# Patient Record
Sex: Female | Born: 1980 | Race: White | Hispanic: No | Marital: Married | Smoking: Never smoker
Health system: Southern US, Community
[De-identification: ages and names within clinical notes are randomized; demographics above are authoritative.]

## PROBLEM LIST (undated history)

## (undated) ENCOUNTER — Inpatient Hospital Stay (HOSPITAL_COMMUNITY): Payer: Self-pay

## (undated) DIAGNOSIS — N3010 Interstitial cystitis (chronic) without hematuria: Secondary | ICD-10-CM

## (undated) DIAGNOSIS — I82812 Embolism and thrombosis of superficial veins of left lower extremities: Secondary | ICD-10-CM

## (undated) DIAGNOSIS — Z8759 Personal history of other complications of pregnancy, childbirth and the puerperium: Secondary | ICD-10-CM

## (undated) DIAGNOSIS — N3281 Overactive bladder: Secondary | ICD-10-CM

## (undated) DIAGNOSIS — F439 Reaction to severe stress, unspecified: Secondary | ICD-10-CM

## (undated) DIAGNOSIS — O2230 Deep phlebothrombosis in pregnancy, unspecified trimester: Secondary | ICD-10-CM

## (undated) DIAGNOSIS — B009 Herpesviral infection, unspecified: Secondary | ICD-10-CM

## (undated) DIAGNOSIS — B977 Papillomavirus as the cause of diseases classified elsewhere: Secondary | ICD-10-CM

## (undated) DIAGNOSIS — N870 Mild cervical dysplasia: Secondary | ICD-10-CM

## (undated) DIAGNOSIS — A749 Chlamydial infection, unspecified: Secondary | ICD-10-CM

## (undated) DIAGNOSIS — D6851 Activated protein C resistance: Secondary | ICD-10-CM

## (undated) DIAGNOSIS — I10 Essential (primary) hypertension: Secondary | ICD-10-CM

## (undated) DIAGNOSIS — R87629 Unspecified abnormal cytological findings in specimens from vagina: Secondary | ICD-10-CM

## (undated) DIAGNOSIS — I82402 Acute embolism and thrombosis of unspecified deep veins of left lower extremity: Secondary | ICD-10-CM

## (undated) HISTORY — DX: Personal history of other complications of pregnancy, childbirth and the puerperium: Z87.59

## (undated) HISTORY — DX: Reaction to severe stress, unspecified: F43.9

## (undated) HISTORY — DX: Interstitial cystitis (chronic) without hematuria: N30.10

## (undated) HISTORY — DX: Deep phlebothrombosis in pregnancy, unspecified trimester: O22.30

## (undated) HISTORY — PX: COLPOSCOPY: SHX161

## (undated) HISTORY — DX: Overactive bladder: N32.81

## (undated) HISTORY — DX: Herpesviral infection, unspecified: B00.9

## (undated) HISTORY — DX: Activated protein C resistance: D68.51

## (undated) HISTORY — DX: Papillomavirus as the cause of diseases classified elsewhere: B97.7

## (undated) HISTORY — DX: Chlamydial infection, unspecified: A74.9

## (undated) HISTORY — PX: OTHER SURGICAL HISTORY: SHX169

## (undated) HISTORY — DX: Essential (primary) hypertension: I10

## (undated) HISTORY — DX: Unspecified abnormal cytological findings in specimens from vagina: R87.629

## (undated) HISTORY — DX: Mild cervical dysplasia: N87.0

## (undated) HISTORY — DX: Acute embolism and thrombosis of unspecified deep veins of left lower extremity: I82.402

---

## 2002-02-12 ENCOUNTER — Other Ambulatory Visit: Admission: RE | Admit: 2002-02-12 | Discharge: 2002-02-12 | Payer: Self-pay | Admitting: Obstetrics and Gynecology

## 2004-10-01 ENCOUNTER — Ambulatory Visit (HOSPITAL_COMMUNITY): Admission: RE | Admit: 2004-10-01 | Discharge: 2004-10-01 | Payer: Self-pay | Admitting: Obstetrics & Gynecology

## 2004-10-06 ENCOUNTER — Inpatient Hospital Stay (HOSPITAL_COMMUNITY): Admission: AD | Admit: 2004-10-06 | Discharge: 2004-10-10 | Payer: Self-pay | Admitting: Obstetrics & Gynecology

## 2007-10-05 ENCOUNTER — Other Ambulatory Visit: Admission: RE | Admit: 2007-10-05 | Discharge: 2007-10-05 | Payer: Self-pay | Admitting: Obstetrics and Gynecology

## 2008-10-06 ENCOUNTER — Other Ambulatory Visit: Admission: RE | Admit: 2008-10-06 | Discharge: 2008-10-06 | Payer: Self-pay | Admitting: Obstetrics and Gynecology

## 2009-11-18 ENCOUNTER — Other Ambulatory Visit: Admission: RE | Admit: 2009-11-18 | Discharge: 2009-11-18 | Payer: Self-pay | Admitting: Obstetrics and Gynecology

## 2010-12-25 ENCOUNTER — Encounter: Payer: Self-pay | Admitting: Obstetrics & Gynecology

## 2011-01-28 ENCOUNTER — Other Ambulatory Visit (HOSPITAL_COMMUNITY)
Admission: RE | Admit: 2011-01-28 | Discharge: 2011-01-28 | Disposition: A | Discharge status: Home / Self Care | Payer: BC Managed Care – PPO | Source: Ambulatory Visit | Attending: Obstetrics & Gynecology | Admitting: Obstetrics & Gynecology

## 2011-01-28 DIAGNOSIS — Z01419 Encounter for gynecological examination (general) (routine) without abnormal findings: Secondary | ICD-10-CM | POA: Insufficient documentation

## 2011-01-28 DIAGNOSIS — Z113 Encounter for screening for infections with a predominantly sexual mode of transmission: Secondary | ICD-10-CM | POA: Insufficient documentation

## 2011-04-22 NOTE — H&P (Signed)
NAME:  Sheila Cline, Sheila Cline                ACCOUNT NO.:  [id number]   MEDICAL RECORD NO.:  [id number]           PATIENT TYPE:   LOCATION:                                 FACILITY:   PHYSICIAN:  Lazaro Arms, M.D.        DATE OF BIRTH:   DATE OF ADMISSION:  DATE OF DISCHARGE:  LH                                HISTORY & PHYSICAL   Sheila Cline is a 30 year old white female, gravida 1, para 0, estimated date of  delivery 10/12/2004 (that is by last menstrual period and 10 week sonogram)  is currently at 39 weeks and 2 days gestation.  The patient is admitted for  cervical ripening and induction of labor.  She has been having some  contractions over the last day or so.  She is very motivated to have a  vaginal delivery and she has an estimated fetal weight of just under 8  pounds at 3387 grams by ultrasound the end of last week.  She has an  adequate pelvis but certainly, I think, she is at the upper limit of what  she will be able to handle vaginally.  Traditionally the patient's blood  pressure is creeping up a little bit today.  Probably partly because of her  pain and discomfort.  It is 130/90 in the office today; and, as a result she  is admitted for cervical ripening tonight and induction tomorrow.   PAST MEDICAL HISTORY:  Negative.   PAST SURGERY:  She had a tympanoplasty as a child.   ALLERGIES:  Negative.   MEDICATIONS:  Zyrtec.   REVIEW OF SYSTEMS:  Otherwise negative.   PAST OBSTETRICAL:  She is nulliparous.  Blood type is A positive, urine drug  screen is negative.  Rubella is immune.  Hepatitis B was negative, HIV was  nonreactive.  Serology was nonreactive.  Pap was normal, GC and Chlamydia  were negative in the first trimester and at 36 weeks.  Her group B strep  culture is negative.  Her AFP was normal.  Her Glucola was normal at 121.  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  She has been in suppression  since 36 weeks.  She has never had any lesions.   PHYSICAL  EXAMINATION:  HEENT:  Unremarkable.  NECK:  Thyroid is normal.  LUNGS:  Lungs are clear.  HEART:  Regular rate and rhythm without regurgitation or gallop.  BREASTS:  Deferred.  ABDOMEN:  Fundal height of 42 cm.  Cervix is 1-2, 50, and minus 2 vertex,  soft and midplane.  EXTREMITIES:  Warm, 1+ edema.   IMPRESSION:  1.  Intrauterine pregnancy at 39+ weeks gestation.  2.  Favorable cervix.  3.  Estimated fetal weight of approximately 8 pounds with my impression that      she has a pelvis adequate for delivery of estimated fetal weight at this      level but really not greater.  4.  Increasing blood pressure.   PLAN:  The patient is admitted for Foley bulb ripening, induction of labor.  She understands the  benefits, indications, and agrees to proceed.     Luth   LHE/MEDQ  D:  10/06/2004  T:  10/06/2004  Job:  [id number]

## 2011-04-22 NOTE — Op Note (Signed)
Sheila Cline, HEINEY                ACCOUNT NO.:  [id number]   MEDICAL RECORD NO.:  [id number]          PATIENT TYPE:  INP   LOCATION:  A412                          FACILITY:  [facility]   PHYSICIAN:  Tilda Burrow, M.D. DATE OF BIRTH:  Jul 27, 1981   DATE OF PROCEDURE:  10/07/2004  DATE OF DISCHARGE:  10/10/2004                                 OPERATIVE REPORT   PREOPERATIVE DIAGNOSES:  1.  Pregnancy at 39 weeks.  2.  Failed induction with suspected cephalopelvic disproportion.   POSTOPERATIVE DIAGNOSES:  1.  Pregnancy at 39 weeks.  2.  Failed induction with suspected cephalopelvic disproportion.   PROCEDURE:  Primary low transverse cervical cesarean section.   SURGEON:  Tilda Burrow, M.D.   ASSISTANTAmie Critchley, C.S.T.   ANESTHESIA:  Epidural.   COMPLICATIONS:  None.   ESTIMATED BLOOD LOSS:  500 mL.   FINDINGS:  Asynclitic presentation of an occiput posterior presentation for  the infant.   DETAILS OF PROCEDURE:  The patient was taken to the operating room with  epidural topped up to achieve adequate analgesia.  A Pfannenstiel-type  incision was performed in standard fashion through the skin and subcu  tissues with Pfannenstiel technique, opening of the fascia, and midline  entry into the peritoneal cavity without difficulty.  A bladder flap was  developed down to the lower uterine segment and a transverse nick made in  the lower uterine segment, identifying the fetal vertex.  Lateral traction  on the incision opened it sufficiently to allow the operator's left hand to  be introduced beneath the vertex, which was rotated into the incision, and  fundal pressure used to deliver the infant.  The vertex delivered well after  being repositioned from its asynclitic occiput posterior presentation.   The cord was clamped and the infant placed in the care of the waiting  pediatrician.   Cord blood samples were obtained and the uterus irrigated with antibiotic-  containing  solution and then the uterus closed using running locking 0  chromic suture ligature.  Hemostasis was satisfactorily achieved and bladder  flap reapproximated with 2-0 chromic.  The abdomen was irrigated with  antibiotic solution, anterior peritoneum closed with 2-0 chromic, the fascia  closed with 0 Vicryl, subcu tissues reapproximated with 2-0 plain  interrupted sutures,  and staple closure of the skin completed the procedure with 500 mL estimated  blood loss.  The patient tolerated the procedure well, went to the recovery  room in good condition, where the epidural catheter was removed with tip  visualized as intact.     [name]   JVF/MEDQ  D:  10/17/2004  T:  10/18/2004  Job:  [id number]   cc:   Francoise Schaumann. Halm, D.O.  [address]  [place]  [place] [address]  Fax: [phone]

## 2011-04-22 NOTE — Discharge Summary (Signed)
Sheila Cline, Sheila Cline                ACCOUNT NO.:  [id number]   MEDICAL RECORD NO.:  [id number]          PATIENT TYPE:  INP   LOCATION:  A412                          FACILITY:  [facility]   PHYSICIAN:  Tilda Burrow, M.D. DATE OF BIRTH:  02-22-1981   DATE OF ADMISSION:  10/06/2004  DATE OF DISCHARGE:  11/06/2005LH                                 DISCHARGE SUMMARY   ADMISSION DIAGNOSES:  1.  Pregnancy [redacted] weeks gestation.  2.  Medical induction of labor, patient requesting.   DISCHARGE DIAGNOSES:  1.  Pregnancy 39 weeks delivered.  2.  Failed induction secondary to fetal malpresentation (asynclitic occiput      posterior).   PROCEDURE:  1.  Foley bulb cervical ripening, Luke Eure/Darlene Lawson.  2.  Pitocin induction of labor.  3.  Epidural catheter placement, J.V. Emelda Fear.  4.  Primary low transverse cervical cesarean section, J.V. Ferguson.   DISCHARGE MEDICATIONS:  1.  Tylox one to two q.4 h. p.r.n. pain, dispense #15.  2.  Motrin 600 mg p.o. q.6 h. p.r.n. mild pain.  3.  Chromogen Forte one p.o. b.i.d. x30 days.   FOLLOW UP:  One week staple removal.   HOSPITAL SUMMARY:  This 30 year old primiparous female requested labor  induction due to persistent pressure and discomfort.  She was felt to have  small suggestions of developing pregnancy-associated hypertension with blood  pressure 130/90 in the office.  She was admitted for cervical ripening  induction with negative protein, greater than normal fundal height at 42 cm,  estimated fetal weight of approximately 8 pounds with pelvis considered  borderline at initial assessment.   HOSPITAL COURSE:  The patient was admitted, had Foley bulb placed overnight  with good cervical change to 3 to 4 cm.  She unfortunately stayed at 3 to 4  cm despite good labor through the day.  Cervix was stretchable to 5 cm/75%/-  2 station @ mid afternoon with epidural catheter in place.  She made no  further progress and despite good labor well  documented by palpation and  monitoring we proceeded to cesarean delivery, delivering a healthy infant  found to be in occiput posterior presentation with slightly asynclitic  presentation.  Almost all of the caput was to one side of the occiput.   The patient was stable for discharge on the third postoperative day  tolerating a regular diet with follow up in 1 week staple removal in our  office.     [name]   JVF/MEDQ  D:  10/17/2004  T:  10/17/2004  Job:  [id number]

## 2011-04-22 NOTE — Op Note (Signed)
Sheila Cline, CANNY                ACCOUNT NO.:  [id number]   MEDICAL RECORD NO.:  [id number]          PATIENT TYPE:  INP   LOCATION:  A412                          FACILITY:  [facility]   PHYSICIAN:  Tilda Burrow, M.D. DATE OF BIRTH:  December 11, 1980   DATE OF PROCEDURE:  DATE OF DISCHARGE:  10/10/2004                                 OPERATIVE REPORT   PROCEDURE:  Epidural catheter placement, 3 p.m., October 07, 2004.   The patient requested epidural after progressing slowly in labor on October 07, 2004, going slowly from 3 cm stretchable to 5 cm, 50%, -1 to -2, with  vertex very molded.  Epidural catheter was placed with the patient in  sitting position, flexed forward, having obtained appropriate consent.  The  back was prepped and draped and loss of resistance technique used to  identify the epidural space.  A 5 mL test dose of 1.5% Xylocaine with  epinephrine was injected, followed by insertion of the epidural catheter 3  cm into the epidural space, removal of the Tuohy needle, taping of catheter  to the back, and follow-up administration of a 7 mL test dose of 0.125%  Marcaine with 2 mcg/mL of fentanyl.  We then used the same solution as 12  mL/hr for labor management.  The patient tolerated the procedure with good  analgesic effect.     [name]   JVF/MEDQ  D:  10/17/2004  T:  10/18/2004  Job:  [id number]

## 2011-08-25 ENCOUNTER — Ambulatory Visit (INDEPENDENT_AMBULATORY_CARE_PROVIDER_SITE_OTHER): Payer: BC Managed Care – PPO | Admitting: Otolaryngology

## 2011-08-25 DIAGNOSIS — H6980 Other specified disorders of Eustachian tube, unspecified ear: Secondary | ICD-10-CM

## 2011-08-25 DIAGNOSIS — H6520 Chronic serous otitis media, unspecified ear: Secondary | ICD-10-CM

## 2011-08-25 DIAGNOSIS — H6990 Unspecified Eustachian tube disorder, unspecified ear: Secondary | ICD-10-CM

## 2011-08-25 DIAGNOSIS — J310 Chronic rhinitis: Secondary | ICD-10-CM

## 2011-09-15 ENCOUNTER — Ambulatory Visit (INDEPENDENT_AMBULATORY_CARE_PROVIDER_SITE_OTHER): Payer: BC Managed Care – PPO | Admitting: Otolaryngology

## 2011-10-20 ENCOUNTER — Ambulatory Visit (INDEPENDENT_AMBULATORY_CARE_PROVIDER_SITE_OTHER): Payer: BC Managed Care – PPO | Admitting: Otolaryngology

## 2011-10-20 DIAGNOSIS — H902 Conductive hearing loss, unspecified: Secondary | ICD-10-CM

## 2011-10-20 DIAGNOSIS — H6980 Other specified disorders of Eustachian tube, unspecified ear: Secondary | ICD-10-CM

## 2012-01-31 ENCOUNTER — Other Ambulatory Visit (HOSPITAL_COMMUNITY)
Admission: RE | Admit: 2012-01-31 | Discharge: 2012-01-31 | Disposition: A | Discharge status: Home / Self Care | Payer: BC Managed Care – PPO | Source: Ambulatory Visit | Attending: Obstetrics and Gynecology | Admitting: Obstetrics and Gynecology

## 2012-01-31 DIAGNOSIS — Z113 Encounter for screening for infections with a predominantly sexual mode of transmission: Secondary | ICD-10-CM | POA: Insufficient documentation

## 2012-01-31 DIAGNOSIS — Z01419 Encounter for gynecological examination (general) (routine) without abnormal findings: Secondary | ICD-10-CM | POA: Insufficient documentation

## 2012-05-16 ENCOUNTER — Other Ambulatory Visit: Payer: Self-pay | Admitting: Obstetrics & Gynecology

## 2012-09-25 ENCOUNTER — Other Ambulatory Visit: Payer: Self-pay | Admitting: Adult Health

## 2012-09-25 DIAGNOSIS — N63 Unspecified lump in unspecified breast: Secondary | ICD-10-CM

## 2012-10-03 ENCOUNTER — Ambulatory Visit (HOSPITAL_COMMUNITY)
Admission: RE | Admit: 2012-10-03 | Discharge: 2012-10-03 | Disposition: A | Discharge status: Home / Self Care | Payer: BC Managed Care – PPO | Source: Ambulatory Visit | Attending: Adult Health | Admitting: Adult Health

## 2012-10-03 ENCOUNTER — Other Ambulatory Visit: Payer: Self-pay | Admitting: Adult Health

## 2012-10-03 DIAGNOSIS — N63 Unspecified lump in unspecified breast: Secondary | ICD-10-CM

## 2013-02-25 ENCOUNTER — Encounter: Payer: Self-pay | Admitting: Obstetrics & Gynecology

## 2013-02-25 ENCOUNTER — Ambulatory Visit (INDEPENDENT_AMBULATORY_CARE_PROVIDER_SITE_OTHER): Payer: BC Managed Care – PPO | Admitting: Obstetrics & Gynecology

## 2013-02-25 VITALS — BP 100/90 | Ht 61.0 in | Wt 175.0 lb

## 2013-02-25 DIAGNOSIS — N3010 Interstitial cystitis (chronic) without hematuria: Secondary | ICD-10-CM

## 2013-02-25 HISTORY — DX: Interstitial cystitis (chronic) without hematuria: N30.10

## 2013-02-25 NOTE — Patient Instructions (Addendum)

## 2013-02-25 NOTE — Progress Notes (Signed)
Patient ID: BETSABE IGLESIA, female   DOB: 1981/03/12, 32 y.o.   MRN: [id number] [id number] in for follow up of her chronic interstital cystits which was originally diagnosed by our office in 10.2013.  Patient has received instructions including dietary restrictions regarding IC and has been receiving variably  Episodic irrigations since diagnosis.  She is unable to use elmiron because of expense.  Currently the patient states her symptoms began to increase last week, 3 weeks after last instillation.  She is an allergy sufferer and we wold expect her symptoms to worsen during this time.  She is taking zyrtec.Overall since beginning therapy her symptoms are "significantly improved."  Exam NEFG no discharge or irritation Vagina  Pink moist without discharge  Urethral meatus is prepped with betadine.  The bladder is drained with straight catheter DMSO 50 cc is instilled without problems and patient to keep in as ling as possible  Imp: Chronic Interstitial cystitis, dx 10.2013 Significantly improved  Plan: Continue on a 4 week cycle for now as maintenance with hope of increasing interval going forward EURE,LUTHER H 02/25/2013 10:00 AM

## 2013-03-25 ENCOUNTER — Encounter: Payer: Self-pay | Admitting: *Deleted

## 2013-03-25 ENCOUNTER — Encounter: Payer: Self-pay | Admitting: Radiology

## 2013-03-25 DIAGNOSIS — B009 Herpesviral infection, unspecified: Secondary | ICD-10-CM

## 2013-03-25 DIAGNOSIS — IMO0002 Reserved for concepts with insufficient information to code with codable children: Secondary | ICD-10-CM

## 2013-03-25 DIAGNOSIS — A749 Chlamydial infection, unspecified: Secondary | ICD-10-CM | POA: Insufficient documentation

## 2013-03-26 ENCOUNTER — Ambulatory Visit (INDEPENDENT_AMBULATORY_CARE_PROVIDER_SITE_OTHER): Payer: BC Managed Care – PPO | Admitting: Obstetrics & Gynecology

## 2013-03-26 ENCOUNTER — Encounter: Payer: Self-pay | Admitting: Obstetrics & Gynecology

## 2013-03-26 VITALS — BP 114/88 | Ht 60.0 in | Wt 177.0 lb

## 2013-03-26 DIAGNOSIS — N3010 Interstitial cystitis (chronic) without hematuria: Secondary | ICD-10-CM

## 2013-03-26 NOTE — Patient Instructions (Signed)

## 2013-03-26 NOTE — Progress Notes (Signed)
Patient ID: TAMBERLYN MIDGLEY, female   DOB: [birth year], 32 y.o.   MRN: [id number] Patient ID: KYAN GIANNONE, female   DOB: Apr 26, 1981, 32 y.o.   MRN: [id number] [id number] in for follow up of her chronic interstital cystits which was originally diagnosed by our office in 10.2013.  Patient has received instructions including dietary restrictions regarding IC and has been receiving variably  Episodic irrigations since diagnosis.  She is unable to use elmiron because of expense.  Currently the patient states her symptoms began to increase last week, 3 weeks after last instillation.  She is an allergy sufferer and we wold expect her symptoms to worsen during this time.  She is taking zyrtec.Overall since beginning therapy her symptoms are "significantly improved."  Exam NEFG no discharge or irritation Vagina  Pink moist without discharge  Urethral meatus is prepped with betadine.  The bladder is drained with straight catheter DMSO 50 cc is instilled without problems and patient to keep in as ling as possible  Imp: Chronic Interstitial cystitis, dx 10.2013 Significantly improved  Plan: Continue on a 4 week cycle for now as maintenance with hope of increasing interval going forward EURE,LUTHER H 03/26/2013 10:29 AM

## 2013-04-23 ENCOUNTER — Encounter: Payer: Self-pay | Admitting: Obstetrics & Gynecology

## 2013-04-23 ENCOUNTER — Ambulatory Visit (INDEPENDENT_AMBULATORY_CARE_PROVIDER_SITE_OTHER): Payer: BC Managed Care – PPO | Admitting: Obstetrics & Gynecology

## 2013-04-23 VITALS — BP 108/80 | Wt 178.0 lb

## 2013-04-23 DIAGNOSIS — N3010 Interstitial cystitis (chronic) without hematuria: Secondary | ICD-10-CM

## 2013-05-02 NOTE — Progress Notes (Signed)
Patient ID: Sheila Cline, female   DOB: November 09, 1981, 32 y.o.   MRN: [id number] Patient ID: Sheila Cline, female   DOB: 10-06-1981, 32 y.o.   MRN: [id number] Patient ID: Sheila Cline, female   DOB: Jan 04, 1981, 32 y.o.   MRN: [id number] G1P1001 in for follow up of her chronic interstital cystits which was originally diagnosed by our office in 10.2013.  Patient has received instructions including dietary restrictions regarding IC and has been receiving variably  Episodic irrigations since diagnosis.  She is unable to use elmiron because of expense.  Currently the patient states her symptoms began to increase last week, 3 weeks after last instillation.  She is an allergy sufferer and we wold expect her symptoms to worsen during this time.  She is taking zyrtec.Overall since beginning therapy her symptoms are "significantly improved."  Exam NEFG no discharge or irritation Vagina  Pink moist without discharge  Urethral meatus is prepped with betadine.  The bladder is drained with straight catheter DMSO 50 cc is instilled without problems and patient to keep in as ling as possible  Imp: Chronic Interstitial cystitis, dx 10.2013 Significantly improved  Plan: Continue on a 4 week cycle for now as maintenance with hope of increasing interval going forward Kelsye Loomer H 5.19.2014 9:21 PM

## 2013-05-28 ENCOUNTER — Encounter: Payer: Self-pay | Admitting: Obstetrics & Gynecology

## 2013-05-28 ENCOUNTER — Ambulatory Visit (INDEPENDENT_AMBULATORY_CARE_PROVIDER_SITE_OTHER): Payer: BC Managed Care – PPO | Admitting: Obstetrics & Gynecology

## 2013-05-28 VITALS — BP 120/80 | Wt 180.0 lb

## 2013-05-28 DIAGNOSIS — N3010 Interstitial cystitis (chronic) without hematuria: Secondary | ICD-10-CM

## 2013-05-28 NOTE — Patient Instructions (Signed)

## 2013-05-28 NOTE — Progress Notes (Signed)
Patient ID: Sheila Cline, female   DOB: [birth year], 32 y.o.   MRN: [id number] G1P1001 in for follow up of her chronic interstital cystits which was originally diagnosed by our office in 10.2013. Patient has received instructions including dietary restrictions regarding IC and has been receiving variably Episodic irrigations since diagnosis. She is unable to use elmiron because of expense.  Currently the patient states her symptoms began to increase last week, 3 weeks after last instillation. She is an allergy sufferer and we wold expect her symptoms to worsen during this time. She is taking zyrtec.Overall since beginning therapy her symptoms are "significantly improved."  Exam  NEFG no discharge or irritation  Vagina Pink moist without discharge  Urethral meatus is prepped with betadine. The bladder is drained with straight catheter  DMSO 50 cc is instilled without problems and patient to keep in as ling as possible  Imp:  Chronic Interstitial cystitis, dx 10.2013  Significantly improved  Plan:  Continue on a 4 week cycle for now as maintenance with hope of increasing interval going forward Deatrice Spanbauer H 05/28/2013 9:32 AM

## 2013-06-10 ENCOUNTER — Other Ambulatory Visit: Payer: Self-pay | Admitting: Obstetrics & Gynecology

## 2013-06-25 ENCOUNTER — Encounter: Payer: Self-pay | Admitting: Obstetrics & Gynecology

## 2013-06-25 ENCOUNTER — Ambulatory Visit (INDEPENDENT_AMBULATORY_CARE_PROVIDER_SITE_OTHER): Payer: BC Managed Care – PPO | Admitting: Obstetrics & Gynecology

## 2013-06-25 VITALS — BP 120/90 | Wt 180.0 lb

## 2013-06-25 DIAGNOSIS — N3010 Interstitial cystitis (chronic) without hematuria: Secondary | ICD-10-CM

## 2013-06-25 NOTE — Patient Instructions (Signed)

## 2013-06-25 NOTE — Progress Notes (Signed)
Patient ID: JAYDIN JALOMO, female   DOB: 09-26-1981, 32 y.o.   MRN: [id number] G1P1001 in for follow up of her chronic interstital cystits which was originally diagnosed by our office in 10.2013. Patient has received instructions including dietary restrictions regarding IC and has been receiving variably Episodic irrigations since diagnosis. She is unable to use elmiron because of expense.  Currently the patient states her symptoms began to increase last week, 3 weeks after last instillation. She is an allergy sufferer and we wold expect her symptoms to worsen during this time. She is taking zyrtec.Overall since beginning therapy her symptoms are "significantly improved."  Exam  NEFG no discharge or irritation  Vagina Pink moist without discharge  Urethral meatus is prepped with betadine. The bladder is drained with straight catheter  DMSO 50 cc is instilled without problems and patient to keep in as ling as possible  Imp:  Chronic Interstitial cystitis, dx 10.2013  Significantly improved  Plan:  Continue on a 4 week cycle for now as maintenance with hope of increasing interval going forward

## 2013-07-23 ENCOUNTER — Ambulatory Visit (INDEPENDENT_AMBULATORY_CARE_PROVIDER_SITE_OTHER): Payer: BC Managed Care – PPO | Admitting: Obstetrics & Gynecology

## 2013-07-23 ENCOUNTER — Encounter: Payer: Self-pay | Admitting: Obstetrics & Gynecology

## 2013-07-23 VITALS — BP 120/80 | Ht 60.0 in | Wt 182.0 lb

## 2013-07-23 DIAGNOSIS — N3010 Interstitial cystitis (chronic) without hematuria: Secondary | ICD-10-CM

## 2013-07-23 MED ORDER — MIRABEGRON ER 25 MG PO TB24: 25.0000 mg | ORAL_TABLET | Freq: Every day | ORAL | Status: DC

## 2013-07-23 NOTE — Progress Notes (Signed)
Patient ID: Sheila Cline, female   DOB: 20-Feb-1981, 32 y.o.   MRN: [id number] G1P1001 in for follow up of her chronic interstital cystits which was originally diagnosed by our office in 10.2013. Patient has received instructions including dietary restrictions regarding IC and has been receiving variably Episodic irrigations since diagnosis. She is unable to use elmiron because of expense.  Currently the patient states her symptoms began to increase last week, 3 weeks after last instillation. She is an allergy sufferer and we wold expect her symptoms to worsen during this time. She is taking zyrtec.Overall since beginning therapy her symptoms are "significantly improved."  Exam  NEFG no discharge or irritation  Vagina Pink moist without discharge  Urethral meatus is prepped with betadine. The bladder is drained with straight catheter  DMSO 50 cc is instilled without problems and patient to keep in as ling as possible  Imp:  Chronic Interstitial cystitis, dx 10.2013  Significantly improved  Plan:  Continue on a 4 week cycle for now as maintenance with hope of increasing interval going forward  Add Myrbertriq 25 qhs to manage some increasing overactive bladder issues as well

## 2013-08-20 ENCOUNTER — Encounter: Payer: Self-pay | Admitting: Obstetrics & Gynecology

## 2013-08-20 ENCOUNTER — Ambulatory Visit (INDEPENDENT_AMBULATORY_CARE_PROVIDER_SITE_OTHER): Payer: BC Managed Care – PPO | Admitting: Obstetrics & Gynecology

## 2013-08-20 VITALS — BP 100/80 | Ht 60.0 in | Wt 183.0 lb

## 2013-08-20 DIAGNOSIS — N3010 Interstitial cystitis (chronic) without hematuria: Secondary | ICD-10-CM

## 2013-08-20 NOTE — Progress Notes (Signed)
Patient ID: Sheila Cline, female   DOB: March 24, 1981, 32 y.o.   MRN: [id number] Patient ID: Sheila Cline, female   DOB: [birth year], 32 y.o.   MRN: [id number] G1P1001 in for follow up of her chronic interstital cystits which was originally diagnosed by our office in 10.2013. Patient has received instructions including dietary restrictions regarding IC and has been receiving variably Episodic irrigations since diagnosis. She is unable to use elmiron because of expense.  Currently the patient states her symptoms began to increase last week, 3 weeks after last instillation. She is an allergy sufferer and we wold expect her symptoms to worsen during this time. She is taking zyrtec.Overall since beginning therapy her symptoms are "significantly improved."  Exam  NEFG no discharge or irritation  Vagina Pink moist without discharge  Urethral meatus is prepped with betadine. The bladder is drained with straight catheter  DMSO 50 cc is instilled without problems and patient to keep in as ling as possible  Imp:  Chronic Interstitial cystitis, dx 10.2013  Significantly improved  Plan:  Pt wants to try 5 week cycle Pt could not get myrbetriq due to cost

## 2013-09-24 ENCOUNTER — Other Ambulatory Visit (HOSPITAL_COMMUNITY)
Admission: RE | Admit: 2013-09-24 | Discharge: 2013-09-24 | Disposition: A | Discharge status: Home / Self Care | Payer: BC Managed Care – PPO | Source: Ambulatory Visit | Attending: Adult Health | Admitting: Adult Health

## 2013-09-24 ENCOUNTER — Encounter: Payer: Self-pay | Admitting: Adult Health

## 2013-09-24 ENCOUNTER — Ambulatory Visit (INDEPENDENT_AMBULATORY_CARE_PROVIDER_SITE_OTHER): Payer: BC Managed Care – PPO | Admitting: Adult Health

## 2013-09-24 VITALS — BP 138/98 | HR 76 | Ht 60.0 in | Wt 185.0 lb

## 2013-09-24 DIAGNOSIS — Z1151 Encounter for screening for human papillomavirus (HPV): Secondary | ICD-10-CM | POA: Insufficient documentation

## 2013-09-24 DIAGNOSIS — N949 Unspecified condition associated with female genital organs and menstrual cycle: Secondary | ICD-10-CM

## 2013-09-24 DIAGNOSIS — Z01419 Encounter for gynecological examination (general) (routine) without abnormal findings: Secondary | ICD-10-CM | POA: Insufficient documentation

## 2013-09-24 DIAGNOSIS — N3281 Overactive bladder: Secondary | ICD-10-CM

## 2013-09-24 DIAGNOSIS — N3010 Interstitial cystitis (chronic) without hematuria: Secondary | ICD-10-CM

## 2013-09-24 DIAGNOSIS — F439 Reaction to severe stress, unspecified: Secondary | ICD-10-CM

## 2013-09-24 HISTORY — DX: Overactive bladder: N32.81

## 2013-09-24 HISTORY — DX: Reaction to severe stress, unspecified: F43.9

## 2013-09-24 NOTE — Patient Instructions (Signed)
Physical in 1year Mammogram at 40 Follow up in 1 week for USStress Stress-related medical problems are becoming increasingly common. The body has a built-in physical response to stressful situations. Faced with pressure, challenge or danger, we need to react quickly. Our bodies release hormones such as cortisol and adrenaline to help do this. These hormones are part of the "fight or flight" response and affect the metabolic rate, heart rate and blood pressure, resulting in a heightened, stressed state that prepares the body for optimum performance in dealing with a stressful situation. It is likely that early man required these mechanisms to stay alive, but usually modern stresses do not call for this, and the same hormones released in today's world can damage health and reduce coping ability. CAUSES  Pressure to perform at work, at school or in sports.  Threats of physical violence.  Money worries.  Arguments.  Family conflicts.  Divorce or separation from significant other.  Bereavement.  New job or unemployment.  Changes in location.  Alcohol or drug abuse. SOMETIMES, THERE IS NO PARTICULAR REASON FOR DEVELOPING STRESS. Almost all people are at risk of being stressed at some time in their lives. It is important to know that some stress is temporary and some is long term.  Temporary stress will go away when a situation is resolved. Most people can cope with short periods of stress, and it can often be relieved by relaxing, taking a walk, chatting through issues with friends, or having a good night's sleep.  Chronic (long-term, continuous) stress is much harder to deal with. It can be psychologically and emotionally damaging. It can be harmful both for an individual and for friends and family. SYMPTOMS Everyone reacts to stress differently. There are some common effects that help [place] recognize it. In times of extreme stress, people may:  Shake uncontrollably.  Breathe faster and  deeper than normal (hyperventilate).  Vomit.  For people with asthma, stress can trigger an attack.  For some people, stress may trigger migraine headaches, ulcers, and body pain. PHYSICAL EFFECTS OF STRESS MAY INCLUDE:  Loss of energy.  Skin problems.  Aches and pains resulting from tense muscles, including neck ache, backache and tension headaches.  Increased pain from arthritis and other conditions.  Irregular heart beat (palpitations).  Periods of irritability or anger.  Apathy or depression.  Anxiety (feeling uptight or worrying).  Unusual behavior.  Loss of appetite.  Comfort eating.  Lack of concentration.  Loss of, or decreased, sex-drive.  Increased smoking, drinking, or recreational drug use.  For women, missed periods.  Ulcers, joint pain, and muscle pain. Post-traumatic stress is the stress caused by any serious accident, strong emotional damage, or extremely difficult or violent experience such as rape or war. Post-traumatic stress victims can experience mixtures of emotions such as fear, shame, depression, guilt or anger. It may include recurrent memories or images that may be haunting. These feelings can last for weeks, months or even years after the traumatic event that triggered them. Specialized treatment, possibly with medicines and psychological therapies, is available. If stress is causing physical symptoms, severe distress or making it difficult for you to function as normal, it is worth seeing your caregiver. It is important to remember that although stress is a usual part of life, extreme or prolonged stress can lead to other illnesses that will need treatment. It is better to visit a doctor sooner rather than later. Stress has been linked to the development of high blood pressure and heart disease,  as well as insomnia and depression. There is no diagnostic test for stress since everyone reacts to it differently. But a caregiver will be able to spot  the physical symptoms, such as:  Headaches.  Shingles.  Ulcers. Emotional distress such as intense worry, low mood or irritability should be detected when the doctor asks pertinent questions to identify any underlying problems that might be the cause. In case there are physical reasons for the symptoms, the doctor may also want to do some tests to exclude certain conditions. If you feel that you are suffering from stress, try to identify the aspects of your life that are causing it. Sometimes you may not be able to change or avoid them, but even a small change can have a positive ripple effect. A simple lifestyle change can make all the difference. STRATEGIES THAT CAN HELP DEAL WITH STRESS:  Delegating or sharing responsibilities.  Avoiding confrontations.  Learning to be more assertive.  Regular exercise.  Avoid using alcohol or street drugs to cope.  Eating a healthy, balanced diet, rich in fruit and vegetables and proteins.  Finding humor or absurdity in stressful situations.  Never taking on more than you know you can handle comfortably.  Organizing your time better to get as much done as possible.  Talking to friends or family and sharing your thoughts and fears.  Listening to music or relaxation tapes.  Tensing and then relaxing your muscles, starting at the toes and working up to the head and neck. If you think that you would benefit from help, either in identifying the things that are causing your stress or in learning techniques to help you relax, see a caregiver who is capable of helping you with this. Rather than relying on medications, it is usually better to try and identify the things in your life that are causing stress and try to deal with them. There are many techniques of managing stress including counseling, psychotherapy, aromatherapy, yoga, and exercise. Your caregiver can help you determine what is best for you. Document Released: 02/11/2003 Document Revised:  02/13/2012 Document Reviewed: 01/08/2008 [facility] Patient Information 2014 [place], [place]. 1 week [place]

## 2013-09-24 NOTE — Progress Notes (Signed)
Patient ID: Sheila Cline, female   DOB: August 13, 1981, 32 y.o.   MRN: [id number] History of Present Illness: Sheila Cline is a 32 year old white female married in for pap and physical.She complains of having to pee a lot and will leak urine and even wet her pants if she holds it too long.Has family stress, husband drinks and does not always come home.She is getting IC treatments with Dr Despina Hidden, but not sure it helps as much.Does not have period with OCs. Did not try myrbetriq yet.  Current Medications, Allergies, Past Medical History, Past Surgical History, Family History and Social History were reviewed in [employer] record.     Review of Systems: Patient denies any headaches, blurred vision, shortness of breath, chest pain, abdominal pain, problems with bowel movements, or intercourse. Has some pressure and bloating and see positives in HPI.No joint pain, not depressed but stressed, declines meds at this time.    Physical Exam:BP 138/98  Pulse 76  Ht 5' (1.524 m)  Wt 185 lb (83.915 kg)  BMI 36.13 kg/m2BOP recheck was 142/98 left arm. General:  Well developed, well nourished, no acute distress Skin:  Warm and dry Neck:  Midline trachea, normal thyroid Lungs; Clear to auscultation bilaterally Breast:  No dominant palpable mass, retraction, or nipple discharge Cardiovascular: Regular rate and rhythm Abdomen:  Soft, non tender, no hepatosplenomegaly Pelvic:  External genitalia is normal in appearance.  The vagina is normal in appearance.  The cervix is smooth, pap performed with HPV and GC/CHL probe obatined.  Uterus is felt to be normal size, shape, and contour and is tender with palpation.  No    adnexal masses or tenderness  Extremities:  No swelling or varicosities noted Psych:  Alert and cooperative,somber today   Impression: Yearly gyn exam Stress IC OAB Contraceptive management Pelvic pressure    Plan: Take myrbetriq 25 mg Number of samples 4  Lot number  [id number]   Exp date 12/16 Call when needs Minastrin Follow up in 1 week for [place] and check BP and see how myrbetriq works Call if needs to talk, or diesires meds, go to [facility] [employer] for self

## 2013-09-25 ENCOUNTER — Telehealth: Payer: Self-pay | Admitting: Adult Health

## 2013-09-25 LAB — GC/CHLAMYDIA PROBE AMP
CT Probe RNA: NEGATIVE
GC Probe RNA: NEGATIVE

## 2013-09-25 NOTE — Telephone Encounter (Signed)
Left message labs negative 

## 2013-10-01 ENCOUNTER — Encounter: Payer: Self-pay | Admitting: Obstetrics & Gynecology

## 2013-10-01 ENCOUNTER — Ambulatory Visit (INDEPENDENT_AMBULATORY_CARE_PROVIDER_SITE_OTHER): Payer: BC Managed Care – PPO | Admitting: Obstetrics & Gynecology

## 2013-10-01 VITALS — BP 110/80 | Wt 183.0 lb

## 2013-10-01 DIAGNOSIS — N3010 Interstitial cystitis (chronic) without hematuria: Secondary | ICD-10-CM

## 2013-10-01 NOTE — Progress Notes (Signed)
Patient ID: Sheila Cline, female   DOB: May 08, 1981, 32 y.o.   MRN: [id number] Patient ID: Sheila Cline, female   DOB: July 01, 1981, 32 y.o.   MRN: [id number] Patient ID: Sheila Cline, female   DOB: [birth year], 32 y.o.   MRN: [id number] G1P1001 in for follow up of her chronic interstital cystits which was originally diagnosed by our office in 10.2013. Patient has received instructions including dietary restrictions regarding IC and has been receiving variably Episodic irrigations since diagnosis. She is unable to use elmiron because of expense.  Currently the patient states her symptoms began to increase last week, 3 weeks after last instillation. She is an allergy sufferer and we wold expect her symptoms to worsen during this time. She is taking zyrtec.Overall since beginning therapy her symptoms are "significantly improved."  Exam  NEFG no discharge or irritation  Vagina Pink moist without discharge  Urethral meatus is prepped with betadine. The bladder is drained with straight catheter  DMSO 50 cc is instilled without problems and patient to keep in as ling as possible  Imp:  Chronic Interstitial cystitis, dx 10.2013  Significantly improved  Plan:  Pt wants to try 6 week cycle Pt could not get myrbetriq due to cost

## 2013-10-03 ENCOUNTER — Ambulatory Visit (INDEPENDENT_AMBULATORY_CARE_PROVIDER_SITE_OTHER): Payer: BC Managed Care – PPO | Admitting: Adult Health

## 2013-10-03 ENCOUNTER — Ambulatory Visit (INDEPENDENT_AMBULATORY_CARE_PROVIDER_SITE_OTHER): Payer: BC Managed Care – PPO

## 2013-10-03 ENCOUNTER — Encounter: Payer: Self-pay | Admitting: Adult Health

## 2013-10-03 VITALS — BP 130/90 | Ht 60.0 in | Wt 186.0 lb

## 2013-10-03 DIAGNOSIS — N949 Unspecified condition associated with female genital organs and menstrual cycle: Secondary | ICD-10-CM

## 2013-10-03 DIAGNOSIS — N9489 Other specified conditions associated with female genital organs and menstrual cycle: Secondary | ICD-10-CM

## 2013-10-03 DIAGNOSIS — R102 Pelvic and perineal pain: Secondary | ICD-10-CM

## 2013-10-03 DIAGNOSIS — N3010 Interstitial cystitis (chronic) without hematuria: Secondary | ICD-10-CM

## 2013-10-03 NOTE — Patient Instructions (Signed)

## 2013-10-03 NOTE — Progress Notes (Signed)
Subjective:     Patient ID: Sheila Cline, female   DOB: [birth year], 32 y.o.   MRN: [id number]  HPI Sheila Cline is back for [place] for pelvic pressure, she has IC and gets DMSO with Dr Despina Hidden.  Review of Systems See HPI Reviewed past medical,surgical, social and family history. Reviewed medications and allergies.     Objective:   Physical Exam BP 130/90  Ht 5' (1.524 m)  Wt 186 lb (84.369 kg)  BMI 36.33 kg/m2   reviewed [place] with pt and also discussed with Dr Despina Hidden the [place].US showed bladder vilume 86.4 cc and PVR 2.3 cc,uterus and ovaries normal, but cervical fullness no masses seen,pt states has to pee again now.  Assessment:     Chroinic IC    Plan:     Try uribel today to see if feels better, Number of samples 8  Lot number [id number]    Exp date 1/15   follow up prn  Review handout on IC

## 2013-10-10 ENCOUNTER — Other Ambulatory Visit: Payer: Self-pay

## 2013-10-17 ENCOUNTER — Other Ambulatory Visit: Payer: Self-pay | Admitting: Adult Health

## 2013-11-12 ENCOUNTER — Ambulatory Visit (INDEPENDENT_AMBULATORY_CARE_PROVIDER_SITE_OTHER): Payer: BC Managed Care – PPO | Admitting: Obstetrics & Gynecology

## 2013-11-12 ENCOUNTER — Ambulatory Visit: Payer: BC Managed Care – PPO | Admitting: Obstetrics & Gynecology

## 2013-11-12 ENCOUNTER — Encounter: Payer: Self-pay | Admitting: Obstetrics & Gynecology

## 2013-11-12 VITALS — BP 124/90 | Ht 60.0 in | Wt 187.0 lb

## 2013-11-12 DIAGNOSIS — N3010 Interstitial cystitis (chronic) without hematuria: Secondary | ICD-10-CM

## 2013-11-12 DIAGNOSIS — N3281 Overactive bladder: Secondary | ICD-10-CM

## 2013-11-12 NOTE — Progress Notes (Signed)
Patient ID: Sheila Cline, female   DOB: 1981-11-15, 32 y.o.   MRN: [id number] Patient ID: [name], female   DOB: 03-13-1981, 32 y.o.   MRN: [id number] Patient ID: [name], female   DOB: Jan 10, 1981, 32 y.o.   MRN: [id number] Patient ID: [name], female   DOB: [birth year], 32 y.o.   MRN: [id number] G1P1001 in for follow up of her chronic interstital cystits which was originally diagnosed by our office in 10.2013. Patient has received instructions including dietary restrictions regarding IC and has been receiving variably Episodic irrigations since diagnosis. She is unable to use elmiron because of expense.  Currently the patient states her symptoms began to increase last week, 3 weeks after last instillation. She is an allergy sufferer and we wold expect her symptoms to worsen during this time. She is taking zyrtec.Overall since beginning therapy her symptoms are "significantly improved."  Exam  NEFG no discharge or irritation  Vagina Pink moist without discharge  Urethral meatus is prepped with betadine. The bladder is drained with straight catheter  DMSO 50 cc is instilled without problems and patient to keep in as ling as possible  Imp:  Chronic Interstitial cystitis, dx 10.2013  Significantly improved  Plan:  Pt wants to try 6 week cycle

## 2013-12-24 ENCOUNTER — Encounter: Payer: Self-pay | Admitting: Obstetrics & Gynecology

## 2013-12-24 ENCOUNTER — Ambulatory Visit (INDEPENDENT_AMBULATORY_CARE_PROVIDER_SITE_OTHER): Payer: BC Managed Care – PPO | Admitting: Obstetrics & Gynecology

## 2013-12-24 VITALS — BP 110/80 | Wt 185.4 lb

## 2013-12-24 DIAGNOSIS — N3281 Overactive bladder: Secondary | ICD-10-CM

## 2013-12-24 DIAGNOSIS — N3010 Interstitial cystitis (chronic) without hematuria: Secondary | ICD-10-CM

## 2013-12-24 NOTE — Progress Notes (Signed)
Patient ID: Sheila Cline, female   DOB: [birth year], 33 y.o.   MRN: [id number] Patient ID: Sheila Cline, female   DOB: [birth year], 33 y.o.   MRN: [id number] Patient ID: Sheila Cline, female   DOB: [birth year], 33 y.o.   MRN: [id number] Patient ID: Sheila Cline, female   DOB: [birth year], 33 y.o.   MRN: [id number] Patient ID: Sheila Cline, female   DOB: [birth year], 33 y.o.   MRN: [id number] G1P1001 in for follow up of her chronic interstital cystits which was originally diagnosed by our office in 10.2013. Patient has received instructions including dietary restrictions regarding IC and has been receiving variably Episodic irrigations since diagnosis. She is unable to use elmiron because of expense.  Currently the patient states her symptoms began to increase last week, 3 weeks after last instillation. She is an allergy sufferer and we wold expect her symptoms to worsen during this time. She is taking zyrtec.Overall since beginning therapy her symptoms are "significantly improved."  Exam  NEFG no discharge or irritation  Vagina Pink moist without discharge  Urethral meatus is prepped with betadine. The bladder is drained with straight catheter  DMSO 50 cc is instilled without problems and patient to keep in as ling as possible  Imp:  Chronic Interstitial cystitis, dx 10.2013  Significantly improved  Plan:  Pt wants to try 6 week cycle

## 2014-02-04 ENCOUNTER — Encounter: Payer: Self-pay | Admitting: Obstetrics & Gynecology

## 2014-02-04 ENCOUNTER — Ambulatory Visit (INDEPENDENT_AMBULATORY_CARE_PROVIDER_SITE_OTHER): Payer: BC Managed Care – PPO | Admitting: Obstetrics & Gynecology

## 2014-02-04 VITALS — BP 100/60 | Wt 184.0 lb

## 2014-02-04 DIAGNOSIS — N3010 Interstitial cystitis (chronic) without hematuria: Secondary | ICD-10-CM

## 2014-02-04 MED ORDER — MIRABEGRON ER 50 MG PO TB24: ORAL_TABLET | ORAL | Status: DC

## 2014-02-04 MED ORDER — PENTOSAN POLYSULFATE SODIUM 100 MG PO CAPS: ORAL_CAPSULE | ORAL | Status: DC

## 2014-02-04 MED ORDER — FLUTICASONE PROPIONATE 50 MCG/ACT NA SUSP: 2.0000 | Freq: Every day | NASAL | Status: DC

## 2014-02-04 MED ORDER — CETIRIZINE HCL 10 MG PO TABS: 10.0000 mg | ORAL_TABLET | Freq: Every day | ORAL | Status: DC

## 2014-02-04 NOTE — Progress Notes (Signed)
Patient ID: Sheila Cline, female   DOB: 11-24-1981, 33 y.o.   MRN: [id number] Patient ID: Sheila Cline, female   DOB: 11-24-1981, 33 y.o.   MRN: [id number] Patient ID: Sheila Cline, female   DOB: 11-24-1981, 33 y.o.   MRN: [id number] Patient ID: Sheila Cline, female   DOB: 11-24-1981, 33 y.o.   MRN: [id number] Patient ID: Sheila Cline, female   DOB: 11-24-1981, 33 y.o.   MRN: [id number] Patient ID: Sheila Cline, female   DOB: 11-24-1981, 33 y.o.   MRN: [id number] G1P1001 in for follow up of her chronic interstital cystits which was originally diagnosed by our office in 10.2013. Patient has received instructions including dietary restrictions regarding IC and has been receiving variably Episodic irrigations since diagnosis. She is unable to use elmiron because of expense.  Currently the patient states her symptoms began to increase last week, 3 weeks after last instillation. She is an allergy sufferer and we wold expect her symptoms to worsen during this time. She is taking zyrtec.Overall since beginning therapy her symptoms are "significantly improved."  Exam  NEFG no discharge or irritation  Vagina Pink moist without discharge  Urethral meatus is prepped with betadine. The bladder is drained with straight catheter  DMSO 50 cc is instilled without problems and patient to keep in as ling as possible  Imp:  Chronic Interstitial cystitis, dx 10.2013  Significantly improved  Plan:  Pt wants to try 6 week cycle

## 2014-03-18 ENCOUNTER — Encounter: Payer: Self-pay | Admitting: Obstetrics & Gynecology

## 2014-03-18 ENCOUNTER — Ambulatory Visit (INDEPENDENT_AMBULATORY_CARE_PROVIDER_SITE_OTHER): Payer: BC Managed Care – PPO | Admitting: Obstetrics & Gynecology

## 2014-03-18 VITALS — BP 120/80 | Wt 187.0 lb

## 2014-03-18 DIAGNOSIS — N3010 Interstitial cystitis (chronic) without hematuria: Secondary | ICD-10-CM

## 2014-03-18 DIAGNOSIS — N3281 Overactive bladder: Secondary | ICD-10-CM

## 2014-03-18 NOTE — Progress Notes (Signed)
Patient ID: Sheila Cline, female   DOB: [birth year], 33 y.o.   MRN: [id number] Patient ID: Sheila Cline, female   DOB: [birth year], 33 y.o.   MRN: [id number] Patient ID: Sheila Cline, female   DOB: [birth year], 33 y.o.   MRN: [id number] Patient ID: Sheila Cline, female   DOB: [birth year], 33 y.o.   MRN: [id number] Patient ID: Sheila Cline, female   DOB: [birth year], 33 y.o.   MRN: [id number] Patient ID: Sheila Cline, female   DOB: [birth year], 33 y.o.   MRN: [id number] Patient ID: Sheila Cline, female   DOB: [birth year], 33 y.o.   MRN: [id number] G1P1001 in for follow up of her chronic interstital cystits which was originally diagnosed by our office in 10.2013. Patient has received instructions including dietary restrictions regarding IC and has been receiving variably Episodic irrigations since diagnosis. She is unable to use elmiron because of expense.  Currently the patient states her symptoms began to increase last week, 3 weeks after last instillation. She is an allergy sufferer and we wold expect her symptoms to worsen during this time. She is taking zyrtec.Overall since beginning therapy her symptoms are "significantly improved."  Exam  NEFG no discharge or irritation  Vagina Pink moist without discharge  Urethral meatus is prepped with betadine. The bladder is drained with straight catheter  DMSO 50 cc is instilled without problems and patient to keep in as ling as possible  Imp:  Chronic Interstitial cystitis, dx 10.2013  Significantly improved  Plan:  Pt wants to try 6 week cycle

## 2014-04-29 ENCOUNTER — Encounter: Payer: Self-pay | Admitting: Obstetrics & Gynecology

## 2014-04-29 ENCOUNTER — Ambulatory Visit (INDEPENDENT_AMBULATORY_CARE_PROVIDER_SITE_OTHER): Payer: BC Managed Care – PPO | Admitting: Obstetrics & Gynecology

## 2014-04-29 VITALS — BP 110/70 | Wt 186.0 lb

## 2014-04-29 DIAGNOSIS — N3281 Overactive bladder: Secondary | ICD-10-CM

## 2014-04-29 DIAGNOSIS — N3010 Interstitial cystitis (chronic) without hematuria: Secondary | ICD-10-CM

## 2014-04-29 NOTE — Progress Notes (Signed)
Patient ID: Sheila Cline, female   DOB: 12/24/1980, 33 y.o.   MRN: [id number] G1P1001 in for follow up of her chronic interstital cystits which was originally diagnosed by our office in 10.2013. Patient has received instructions including dietary restrictions regarding IC and has been receiving variably Episodic irrigations since diagnosis.   Currently the patient states her symptoms began to increase last week, 3 weeks after last instillation. She is an allergy sufferer and we wold expect her symptoms to worsen during this time. She is taking zyrtec.Overall since beginning therapy her symptoms are "significantly improved."  Exam  NEFG no discharge or irritation  Vagina Pink moist without discharge  Urethral meatus is prepped with betadine. The bladder is drained with straight catheter  DMSO 50 cc is instilled without problems and patient to keep in as ling as possible  Imp:  Chronic Interstitial cystitis, dx 10.2013  Significantly improved  Plan:  Pt wants to try 6 week cycle

## 2014-06-10 ENCOUNTER — Ambulatory Visit (INDEPENDENT_AMBULATORY_CARE_PROVIDER_SITE_OTHER): Payer: BC Managed Care – PPO | Admitting: Obstetrics & Gynecology

## 2014-06-10 ENCOUNTER — Encounter: Payer: Self-pay | Admitting: Obstetrics & Gynecology

## 2014-06-10 VITALS — BP 100/80 | Wt 189.0 lb

## 2014-06-10 DIAGNOSIS — N3010 Interstitial cystitis (chronic) without hematuria: Secondary | ICD-10-CM

## 2014-06-10 DIAGNOSIS — N3281 Overactive bladder: Secondary | ICD-10-CM

## 2014-06-10 NOTE — Progress Notes (Signed)
Patient ID: Sheila Cline, female   DOB: 1981/09/28, 33 y.o.   MRN: [id number] Patient ID: Sheila Cline, female   DOB: 1981/09/28, 33 y.o.   MRN: [id number] Blood pressure 100/80, weight 189 lb (85.73 kg).  G1P1001 in for follow up of her chronic interstital cystits which was originally diagnosed by our office in 10.2013. Patient has received instructions including dietary restrictions regarding IC and has been receiving variably Episodic irrigations since diagnosis.   Currently the patient states her symptoms began to increase last week, 3 weeks after last instillation. She is an allergy sufferer and we wold expect her symptoms to worsen during this time. She is taking zyrtec.Overall since beginning therapy her symptoms are "significantly improved."  Exam  NEFG no discharge or irritation  Vagina Pink moist without discharge  Urethral meatus is prepped with betadine. The bladder is drained with straight catheter  DMSO 50 cc is instilled without problems and patient to keep in as ling as possible  Imp:  Chronic Interstitial cystitis, dx 10.2013  Significantly improved  Plan:  Pt wants to try 6 week cycle

## 2014-07-22 ENCOUNTER — Encounter: Payer: Self-pay | Admitting: Obstetrics & Gynecology

## 2014-07-22 ENCOUNTER — Ambulatory Visit (INDEPENDENT_AMBULATORY_CARE_PROVIDER_SITE_OTHER): Payer: BC Managed Care – PPO | Admitting: Obstetrics & Gynecology

## 2014-07-22 VITALS — BP 102/74 | Ht 60.0 in | Wt 194.0 lb

## 2014-07-22 DIAGNOSIS — N3010 Interstitial cystitis (chronic) without hematuria: Secondary | ICD-10-CM

## 2014-07-22 DIAGNOSIS — N3281 Overactive bladder: Secondary | ICD-10-CM

## 2014-07-22 NOTE — Progress Notes (Signed)
Patient ID: Sheila Cline, female   DOB: [birth year], 33 y.o.   MRN: [id number] Patient ID: Sheila Cline, female   DOB: [birth year], 33 y.o.   MRN: [id number] Patient ID: Sheila Cline, female   DOB: [birth year], 33 y.o.   MRN: [id number] Blood pressure 102/74, height 5' (1.524 m), weight 194 lb (87.998 kg).  G1P1001 in for follow up of her chronic interstital cystits which was originally diagnosed by our office in 10.2013. Patient has received instructions including dietary restrictions regarding IC and has been receiving variably Episodic irrigations since diagnosis.   Currently the patient states her symptoms began to increase last week, 3 weeks after last instillation. She is an allergy sufferer and we wold expect her symptoms to worsen during this time. She is taking zyrtec.Overall since beginning therapy her symptoms are "significantly improved."  Exam  NEFG no discharge or irritation  Vagina Pink moist without discharge  Urethral meatus is prepped with betadine. The bladder is drained with straight catheter  DMSO 50 cc is instilled without problems and patient to keep in as ling as possible  Imp:  Chronic Interstitial cystitis, dx 10.2013  Significantly improved  Plan:  reinstillation in 8 weeks

## 2014-08-20 IMAGING — US US BREAST*R*
1 series · 2 of 2 positions shown · non-contrast
Comparison: None.

CLINICAL DATA: The patient's healthcare professional felt a lump
in the right upper outer quadrant.  The patient does not feel a
mass.

DIGITAL DIAGNOSTIC BILATERAL MAMMOGRAM WITH CAD AND THE RIGHT
BREAST ULTRASOUND:

[Series 1: us breast*right* · 0.07mm/px · 2 of 2 slices shown]
[im 1/2]
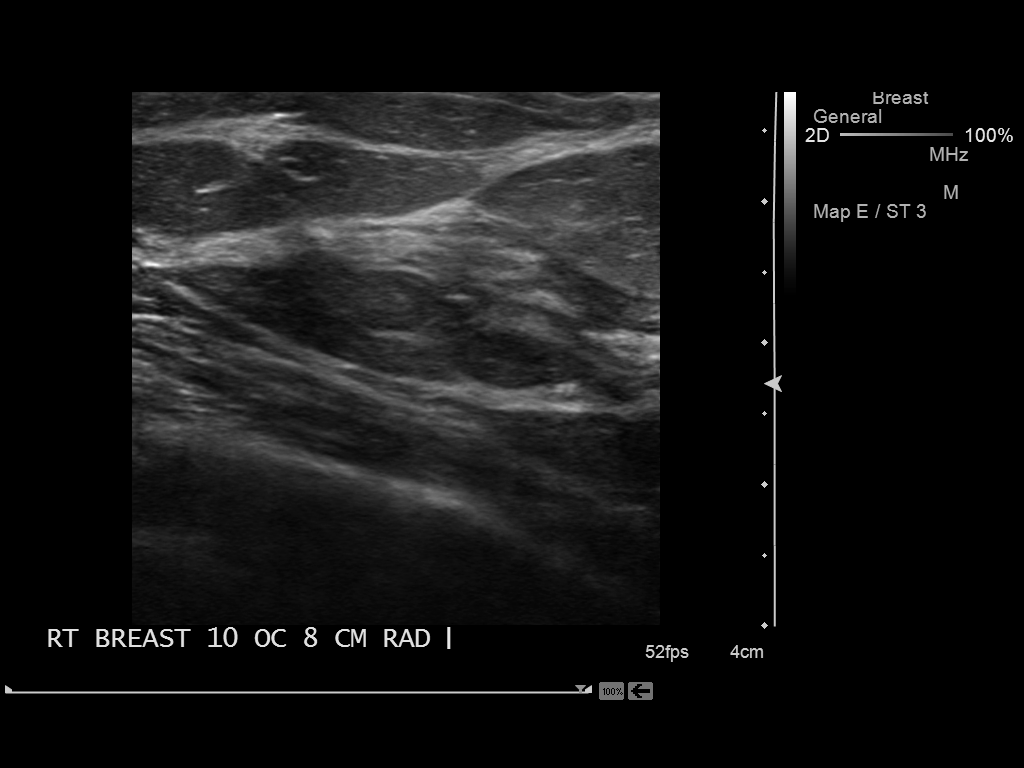
[im 2/2]
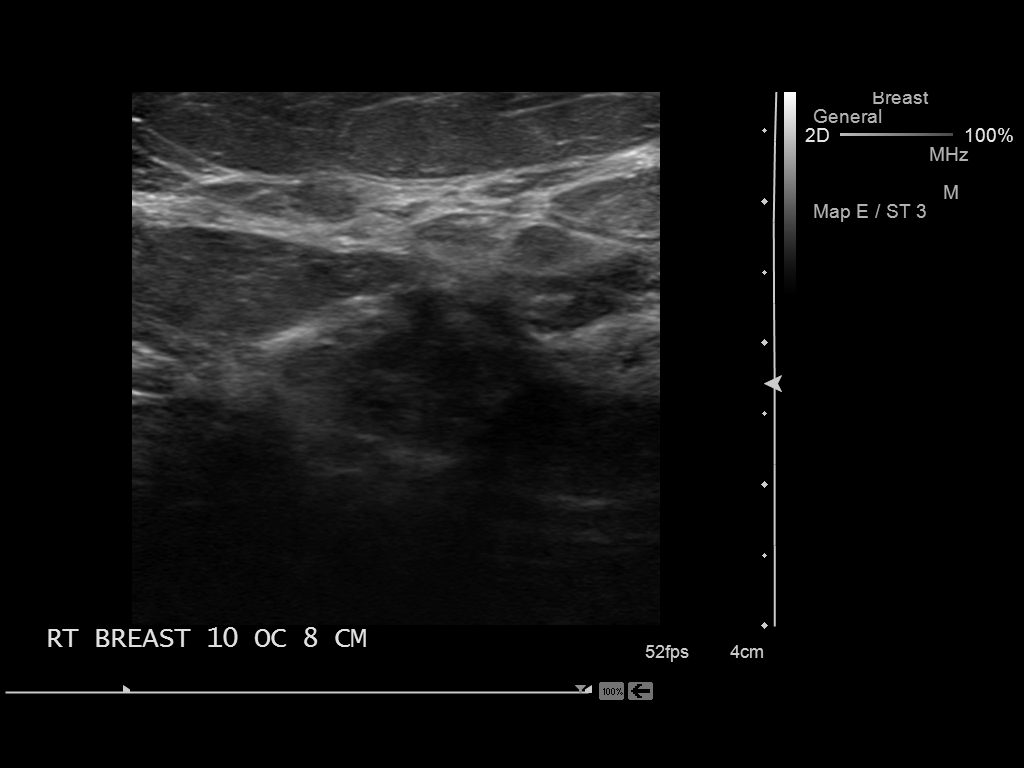

[2 of 2 positions shown; findings below may reference images not displayed]

FINDINGS: There are scattered fibroglandular densities.  There is
slightly more glandular tissue in the inferior portion of the left
breast as compared to the right which is compatible with normal
glandular asymmetry.  There is no suspicious mass, architectural
distortion or calcification to suggest malignancy.
Mammographic images were processed with CAD.

On physical exam, no mass is palpated in the right upper outer
quadrant.

Ultrasound is performed, showing fatty and fibroglandular tissue
tissue in the right upper outer quadrant with no mass, distortion
or shadowing to suggest malignancy.
IMPRESSION: No mammographic or sonographic evidence of malignancy.

RECOMMENDATION:
Yearly screening mammography should begin at age 40 unless
clinically indicated earlier.

I have discussed the findings and recommendations with the patient.
Results were also provided in writing at the conclusion of the
visit.

BI-RADS CATEGORY 1:  Negative.

## 2014-09-16 ENCOUNTER — Ambulatory Visit: Payer: BC Managed Care – PPO | Admitting: Obstetrics & Gynecology

## 2014-10-06 ENCOUNTER — Encounter: Payer: Self-pay | Admitting: Obstetrics & Gynecology

## 2014-10-24 ENCOUNTER — Other Ambulatory Visit: Payer: Self-pay | Admitting: Adult Health

## 2014-11-11 ENCOUNTER — Telehealth: Payer: Self-pay | Admitting: Obstetrics & Gynecology

## 2014-11-11 MED ORDER — NORETHIN ACE-ETH ESTRAD-FE 1-20 MG-MCG(24) PO CHEW: 1.0000 | CHEWABLE_TABLET | Freq: Every day | ORAL | Status: DC

## 2014-11-11 NOTE — Telephone Encounter (Signed)
Insurance will not cover her OCs, will give samples

## 2014-11-11 NOTE — Telephone Encounter (Signed)
Pt states no longer has previous insurance now has [employer] and needs prior auth for her Minastrin 24 Fe. Per Rodena PietyFran Cresenzo, CNM is ok to give 2 sample boxes until prior auth can be completed. Pt to pick up samples at front desk.

## 2016-10-05 ENCOUNTER — Other Ambulatory Visit: Payer: Self-pay | Admitting: Obstetrics and Gynecology

## 2016-10-05 DIAGNOSIS — O3680X0 Pregnancy with inconclusive fetal viability, not applicable or unspecified: Secondary | ICD-10-CM

## 2016-10-06 ENCOUNTER — Ambulatory Visit (INDEPENDENT_AMBULATORY_CARE_PROVIDER_SITE_OTHER): Payer: Medicaid Other

## 2016-10-06 DIAGNOSIS — O3401 Maternal care for unspecified congenital malformation of uterus, first trimester: Secondary | ICD-10-CM | POA: Diagnosis not present

## 2016-10-06 DIAGNOSIS — O3680X0 Pregnancy with inconclusive fetal viability, not applicable or unspecified: Secondary | ICD-10-CM

## 2016-10-06 DIAGNOSIS — Z3A01 Less than 8 weeks gestation of pregnancy: Secondary | ICD-10-CM | POA: Diagnosis not present

## 2016-10-06 NOTE — Progress Notes (Signed)
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

## 2016-10-07 ENCOUNTER — Telehealth: Payer: Self-pay | Admitting: Obstetrics & Gynecology

## 2016-10-07 NOTE — Telephone Encounter (Signed)
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  Pt verbalized understanding.

## 2016-10-26 ENCOUNTER — Encounter: Payer: Self-pay | Admitting: Advanced Practice Midwife

## 2016-10-26 ENCOUNTER — Other Ambulatory Visit (HOSPITAL_COMMUNITY)
Admission: RE | Admit: 2016-10-26 | Discharge: 2016-10-26 | Disposition: A | Discharge status: Home / Self Care | Payer: Medicaid Other | Source: Ambulatory Visit | Attending: Advanced Practice Midwife | Admitting: Advanced Practice Midwife

## 2016-10-26 ENCOUNTER — Ambulatory Visit (INDEPENDENT_AMBULATORY_CARE_PROVIDER_SITE_OTHER): Payer: Medicaid Other | Admitting: Advanced Practice Midwife

## 2016-10-26 VITALS — BP 104/70 | HR 88 | Wt 189.5 lb

## 2016-10-26 DIAGNOSIS — Z1151 Encounter for screening for human papillomavirus (HPV): Secondary | ICD-10-CM | POA: Diagnosis present

## 2016-10-26 DIAGNOSIS — O09521 Supervision of elderly multigravida, first trimester: Secondary | ICD-10-CM | POA: Diagnosis not present

## 2016-10-26 DIAGNOSIS — Z01411 Encounter for gynecological examination (general) (routine) with abnormal findings: Secondary | ICD-10-CM | POA: Diagnosis present

## 2016-10-26 DIAGNOSIS — Z113 Encounter for screening for infections with a predominantly sexual mode of transmission: Secondary | ICD-10-CM | POA: Insufficient documentation

## 2016-10-26 DIAGNOSIS — O09529 Supervision of elderly multigravida, unspecified trimester: Secondary | ICD-10-CM | POA: Insufficient documentation

## 2016-10-26 DIAGNOSIS — O0991 Supervision of high risk pregnancy, unspecified, first trimester: Secondary | ICD-10-CM | POA: Diagnosis not present

## 2016-10-26 DIAGNOSIS — Z3481 Encounter for supervision of other normal pregnancy, first trimester: Secondary | ICD-10-CM

## 2016-10-26 DIAGNOSIS — Z3A09 9 weeks gestation of pregnancy: Secondary | ICD-10-CM | POA: Diagnosis not present

## 2016-10-26 DIAGNOSIS — Z1389 Encounter for screening for other disorder: Secondary | ICD-10-CM | POA: Diagnosis not present

## 2016-10-26 DIAGNOSIS — Z3490 Encounter for supervision of normal pregnancy, unspecified, unspecified trimester: Secondary | ICD-10-CM | POA: Insufficient documentation

## 2016-10-26 DIAGNOSIS — Z23 Encounter for immunization: Secondary | ICD-10-CM

## 2016-10-26 DIAGNOSIS — Z331 Pregnant state, incidental: Secondary | ICD-10-CM | POA: Diagnosis not present

## 2016-10-26 DIAGNOSIS — Z98891 History of uterine scar from previous surgery: Secondary | ICD-10-CM | POA: Insufficient documentation

## 2016-10-26 DIAGNOSIS — Z3401 Encounter for supervision of normal first pregnancy, first trimester: Secondary | ICD-10-CM

## 2016-10-26 DIAGNOSIS — Z124 Encounter for screening for malignant neoplasm of cervix: Secondary | ICD-10-CM

## 2016-10-26 DIAGNOSIS — Z3682 Encounter for antenatal screening for nuchal translucency: Secondary | ICD-10-CM

## 2016-10-26 LAB — POCT URINALYSIS DIPSTICK
Blood, UA: NEGATIVE
Glucose, UA: NEGATIVE
Ketones, UA: NEGATIVE
Leukocytes, UA: NEGATIVE
Nitrite, UA: NEGATIVE
Protein, UA: NEGATIVE

## 2016-10-26 NOTE — Progress Notes (Signed)
  Subjective:    Sheila Cline is a G2P1001 6731w1d being seen today for her first obstetrical visit.  Her obstetrical history is significant for advanced maternal age.  Pregnancy history fully reviewed.  Patient reports no complaints.  Vitals:   10/26/16 0859  BP: 104/70  Pulse: 88  Weight: 189 lb 8 oz (86 kg)    HISTORY: OB History  Gravida Para Term Preterm AB Living  2 1 1     1   SAB TAB Ectopic Multiple Live Births          1    # Outcome Date GA Lbr Len/2nd Weight Sex Delivery Anes PTL Lv  2 Current           1 Term 10/07/04 7530w2d  8 lb 14 oz (4.026 kg) F CS-LTranv Spinal N LIV     Past Medical History:  Diagnosis Date  . Abnormal pap   . Chlamydia   . Dysplasia of cervix, low grade (CIN 1)   . HPV (human papilloma virus) infection   . HSV-2 (herpes simplex virus 2) infection   . Interstitial cystitis   . OAB (overactive bladder) 09/24/2013  . Stress at home 09/24/2013   Past Surgical History:  Procedure Laterality Date  . CESAREAN SECTION    . tubes in ears     Family History  Problem Relation Age of Onset  . Interstitial cystitis Maternal Grandmother   . Stroke Maternal Grandmother   . Heart disease Maternal Grandmother   . Cancer Maternal Aunt     breast  . Hypertension Mother   . Asthma Mother   . Asthma Brother   . Asthma Daughter   . Hypertension Maternal Grandfather      Exam       Pelvic Exam:    Perineum: Normal Perineum   Vulva: normal   Vagina:  normal mucosa, normal discharge, no palpable nodules   Uterus Normal, Gravid, FH: 9     Cervix: normal   Adnexa: Not palpable   Urinary:  urethral meatus normal    System:     Skin: normal coloration and turgor, no rashes    Neurologic: oriented, normal, normal mood   Extremities: normal strength, tone, and muscle mass   HEENT PERRLA   Mouth/Teeth mucous membranes moist, normal dentition   Neck supple and no masses   Cardiovascular: regular rate and rhythm   Respiratory:  appears  well, vitals normal, no respiratory distress, acyanotic   Abdomen: soft, non-tender;  FHR: 150 US          Assessment:    Pregnancy: G2P1001 Patient Active Problem List   Diagnosis Date Noted  . Supervision of normal pregnancy 10/26/2016  . History of cesarean section, low transverse 10/26/2016  . Stress at home 09/24/2013  . OAB (overactive bladder) 09/24/2013  . Chlamydia 03/25/2013  . Abnormal pap 03/25/2013  . HSV-2 (herpes simplex virus 2) infection 03/25/2013  . Chronic interstitial cystitis 02/25/2013        Plan:     Initial labs drawn. Continue prenatal vitamins  Problem list reviewed and updated  Reviewed n/v relief measures and warning s/s to report  Reviewed recommended weight gain based on pre-gravid BMI  Encouraged well-balanced diet Genetic Screening discussed Integrated Screen: requested.  Ultrasound discussed; fetal survey: requested.  Return in about 4 weeks (around 11/23/2016) for LROB.  CRESENZO-DISHMAN,Shyheem Whitham 10/26/2016

## 2016-10-26 NOTE — Patient Instructions (Signed)
 First Trimester of Pregnancy The first trimester of pregnancy is from week 1 until the end of week 12 (months 1 through 3). A week after a sperm fertilizes an egg, the egg will implant on the wall of the uterus. This embryo will begin to develop into a baby. Genes from you and your partner are forming the baby. The female genes determine whether the baby is a boy or a girl. At 6-8 weeks, the eyes and face are formed, and the heartbeat can be seen on ultrasound. At the end of 12 weeks, all the baby's organs are formed.  Now that you are pregnant, you will want to do everything you can to have a healthy baby. Two of the most important things are to get good prenatal care and to follow your health care provider's instructions. Prenatal care is all the medical care you receive before the baby's birth. This care will help prevent, find, and treat any problems during the pregnancy and childbirth. BODY CHANGES Your body goes through many changes during pregnancy. The changes vary from woman to woman.   You may gain or lose a couple of pounds at first.  You may feel sick to your stomach (nauseous) and throw up (vomit). If the vomiting is uncontrollable, call your health care provider.  You may tire easily.  You may develop headaches that can be relieved by medicines approved by your health care provider.  You may urinate more often. Painful urination may mean you have a bladder infection.  You may develop heartburn as a result of your pregnancy.  You may develop constipation because certain hormones are causing the muscles that push waste through your intestines to slow down.  You may develop hemorrhoids or swollen, bulging veins (varicose veins).  Your breasts may begin to grow larger and become tender. Your nipples may stick out more, and the tissue that surrounds them (areola) may become darker.  Your gums may bleed and may be sensitive to brushing and flossing.  Dark spots or blotches  (chloasma, mask of pregnancy) may develop on your face. This will likely fade after the baby is born.  Your menstrual periods will stop.  You may have a loss of appetite.  You may develop cravings for certain kinds of food.  You may have changes in your emotions from day to day, such as being excited to be pregnant or being concerned that something may go wrong with the pregnancy and baby.  You may have more vivid and strange dreams.  You may have changes in your hair. These can include thickening of your hair, rapid growth, and changes in texture. Some women also have hair loss during or after pregnancy, or hair that feels dry or thin. Your hair will most likely return to normal after your baby is born. WHAT TO EXPECT AT YOUR PRENATAL VISITS During a routine prenatal visit:  You will be weighed to make sure you and the baby are growing normally.  Your blood pressure will be taken.  Your abdomen will be measured to track your baby's growth.  The fetal heartbeat will be listened to starting around week 10 or 12 of your pregnancy.  Test results from any previous visits will be discussed. Your health care provider may ask you:  How you are feeling.  If you are feeling the baby move.  If you have had any abnormal symptoms, such as leaking fluid, bleeding, severe headaches, or abdominal cramping.  If you have any questions. Other   tests that may be performed during your first trimester include:  Blood tests to find your blood type and to check for the presence of any previous infections. They will also be used to check for low iron levels (anemia) and Rh antibodies. Later in the pregnancy, blood tests for diabetes will be done along with other tests if problems develop.  Urine tests to check for infections, diabetes, or protein in the urine.  An ultrasound to confirm the proper growth and development of the baby.  An amniocentesis to check for possible genetic problems.  Fetal  screens for spina bifida and Down syndrome.  You may need other tests to make sure you and the baby are doing well. HOME CARE INSTRUCTIONS  Medicines  Follow your health care provider's instructions regarding medicine use. Specific medicines may be either safe or unsafe to take during pregnancy.  Take your prenatal vitamins as directed.  If you develop constipation, try taking a stool softener if your health care provider approves. Diet  Eat regular, well-balanced meals. Choose a variety of foods, such as meat or vegetable-based protein, fish, milk and low-fat dairy products, vegetables, fruits, and whole grain breads and cereals. Your health care provider will help you determine the amount of weight gain that is right for you.  Avoid raw meat and uncooked cheese. These carry germs that can cause birth defects in the baby.  Eating four or five small meals rather than three large meals a day may help relieve nausea and vomiting. If you start to feel nauseous, eating a few soda crackers can be helpful. Drinking liquids between meals instead of during meals also seems to help nausea and vomiting.  If you develop constipation, eat more high-fiber foods, such as fresh vegetables or fruit and whole grains. Drink enough fluids to keep your urine clear or pale yellow. Activity and Exercise  Exercise only as directed by your health care provider. Exercising will help you:  Control your weight.  Stay in shape.  Be prepared for labor and delivery.  Experiencing pain or cramping in the lower abdomen or low back is a good sign that you should stop exercising. Check with your health care provider before continuing normal exercises.  Try to avoid standing for long periods of time. Move your legs often if you must stand in one place for a long time.  Avoid heavy lifting.  Wear low-heeled shoes, and practice good posture.  You may continue to have sex unless your health care provider directs you  otherwise. Relief of Pain or Discomfort  Wear a good support bra for breast tenderness.   Take warm sitz baths to soothe any pain or discomfort caused by hemorrhoids. Use hemorrhoid cream if your health care provider approves.   Rest with your legs elevated if you have leg cramps or low back pain.  If you develop varicose veins in your legs, wear support hose. Elevate your feet for 15 minutes, 3-4 times a day. Limit salt in your diet. Prenatal Care  Schedule your prenatal visits by the twelfth week of pregnancy. They are usually scheduled monthly at first, then more often in the last 2 months before delivery.  Write down your questions. Take them to your prenatal visits.  Keep all your prenatal visits as directed by your health care provider. Safety  Wear your seat belt at all times when driving.  Make a list of emergency phone numbers, including numbers for family, friends, the hospital, and police and fire departments. General   Tips  Ask your health care provider for a referral to a local prenatal education class. Begin classes no later than at the beginning of month 6 of your pregnancy.  Ask for help if you have counseling or nutritional needs during pregnancy. Your health care provider can offer advice or refer you to specialists for help with various needs.  Do not use hot tubs, steam rooms, or saunas.  Do not douche or use tampons or scented sanitary pads.  Do not cross your legs for long periods of time.  Avoid cat litter boxes and soil used by cats. These carry germs that can cause birth defects in the baby and possibly loss of the fetus by miscarriage or stillbirth.  Avoid all smoking, herbs, alcohol, and medicines not prescribed by your health care provider. Chemicals in these affect the formation and growth of the baby.  Schedule a dentist appointment. At home, brush your teeth with a soft toothbrush and be gentle when you floss. SEEK MEDICAL CARE IF:   You have  dizziness.  You have mild pelvic cramps, pelvic pressure, or nagging pain in the abdominal area.  You have persistent nausea, vomiting, or diarrhea.  You have a bad smelling vaginal discharge.  You have pain with urination.  You notice increased swelling in your face, hands, legs, or ankles. SEEK IMMEDIATE MEDICAL CARE IF:   You have a fever.  You are leaking fluid from your vagina.  You have spotting or bleeding from your vagina.  You have severe abdominal cramping or pain.  You have rapid weight gain or loss.  You vomit blood or material that looks like coffee grounds.  You are exposed to German measles and have never had them.  You are exposed to fifth disease or chickenpox.  You develop a severe headache.  You have shortness of breath.  You have any kind of trauma, such as from a fall or a car accident. Document Released: 11/15/2001 Document Revised: 04/07/2014 Document Reviewed: 10/01/2013 [employer] Patient Information 2015 [employer]. This information is not intended to replace advice given to you by your health care provider. Make sure you discuss any questions you have with your health care provider.   Nausea & Vomiting  Have saltine crackers or pretzels by your bed and eat a few bites before you raise your head out of bed in the morning  Eat small frequent meals throughout the day instead of large meals  Drink plenty of fluids throughout the day to stay hydrated, just don't drink a lot of fluids with your meals.  This can make your stomach fill up faster making you feel sick  Do not brush your teeth right after you eat  Products with real ginger are good for nausea, like ginger ale and ginger hard candy Make sure it says made with real ginger!  Sucking on sour candy like lemon heads is also good for nausea  If your prenatal vitamins make you nauseated, take them at night so you will sleep through the nausea  Sea Bands  If you feel like you need  medicine for the nausea & vomiting please let us know  If you are unable to keep any fluids or food down please let us know   Constipation  Drink plenty of fluid, preferably water, throughout the day  Eat foods high in fiber such as fruits, vegetables, and grains  Exercise, such as walking, is a good way to keep your bowels regular  Drink warm fluids, especially warm   prune juice, or decaf coffee  Eat a 1/2 cup of real oatmeal (not instant), 1/2 cup applesauce, and 1/2-1 cup warm prune juice every day  If needed, you may take Colace (docusate sodium) stool softener once or twice a day to help keep the stool soft. If you are pregnant, wait until you are out of your first trimester (12-14 weeks of pregnancy)  If you still are having problems with constipation, you may take Miralax once daily as needed to help keep your bowels regular.  If you are pregnant, wait until you are out of your first trimester (12-14 weeks of pregnancy)  Safe Medications in Pregnancy   Acne: Benzoyl Peroxide Salicylic Acid  Backache/Headache: Tylenol: 2 regular strength every 4 hours OR              2 Extra strength every 6 hours  Colds/Coughs/Allergies: Benadryl (alcohol free) 25 mg every 6 hours as needed Breath right strips Claritin Cepacol throat lozenges Chloraseptic throat spray Cold-Eeze- up to three times per day Cough drops, alcohol free Flonase (by prescription only) Guaifenesin Mucinex Robitussin DM (plain only, alcohol free) Saline nasal spray/drops Sudafed (pseudoephedrine) & Actifed ** use only after [redacted] weeks gestation and if you do not have high blood pressure Tylenol Vicks Vaporub Zinc lozenges Zyrtec   Constipation: Colace Ducolax suppositories Fleet enema Glycerin suppositories Metamucil Milk of magnesia Miralax Senokot Smooth move tea  Diarrhea: Kaopectate Imodium A-D  *NO pepto Bismol  Hemorrhoids: Anusol Anusol HC Preparation  H Tucks  Indigestion: Tums Maalox Mylanta Zantac  Pepcid  Insomnia: Benadryl (alcohol free) 25mg every 6 hours as needed Tylenol PM Unisom, no Gelcaps  Leg Cramps: Tums MagGel  Nausea/Vomiting:  Bonine Dramamine Emetrol Ginger extract Sea bands Meclizine  Nausea medication to take during pregnancy:  Unisom (doxylamine succinate 25 mg tablets) Take one tablet daily at bedtime. If symptoms are not adequately controlled, the dose can be increased to a maximum recommended dose of two tablets daily (1/2 tablet in the morning, 1/2 tablet mid-afternoon and one at bedtime). Vitamin B6 100mg tablets. Take one tablet twice a day (up to 200 mg per day).  Skin Rashes: Aveeno products Benadryl cream or 25mg every 6 hours as needed Calamine Lotion 1% cortisone cream  Yeast infection: Gyne-lotrimin 7 Monistat 7   **If taking multiple medications, please check labels to avoid duplicating the same active ingredients **take medication as directed on the label ** Do not exceed 4000 mg of tylenol in 24 hours **Do not take medications that contain aspirin or ibuprofen      

## 2016-10-27 LAB — URINALYSIS, ROUTINE W REFLEX MICROSCOPIC
Bilirubin, UA: NEGATIVE
Glucose, UA: NEGATIVE
Ketones, UA: NEGATIVE
Leukocytes, UA: NEGATIVE
Nitrite, UA: NEGATIVE
Protein, UA: NEGATIVE
RBC, UA: NEGATIVE
Specific Gravity, UA: 1.023 (ref 1.005–1.030)
Urobilinogen, Ur: 0.2 mg/dL (ref 0.2–1.0)
pH, UA: 7 (ref 5.0–7.5)

## 2016-10-27 LAB — HIV ANTIBODY (ROUTINE TESTING W REFLEX): HIV Screen 4th Generation wRfx: NONREACTIVE

## 2016-10-27 LAB — PMP SCREEN PROFILE (10S), URINE
Amphetamine Screen, Ur: NEGATIVE ng/mL
Barbiturate Screen, Ur: NEGATIVE ng/mL
Benzodiazepine Screen, Urine: NEGATIVE ng/mL
Cannabinoids Ur Ql Scn: NEGATIVE ng/mL
Cocaine(Metab.)Screen, Urine: NEGATIVE ng/mL
Creatinine(Crt), U: 98.6 mg/dL (ref 20.0–300.0)
Methadone Scn, Ur: NEGATIVE ng/mL
Opiate Scrn, Ur: NEGATIVE ng/mL
Oxycodone+Oxymorphone Ur Ql Scn: NEGATIVE ng/mL
PCP Scrn, Ur: NEGATIVE ng/mL
Ph of Urine: 6.5 (ref 4.5–8.9)
Propoxyphene, Screen: NEGATIVE ng/mL

## 2016-10-27 LAB — VARICELLA ZOSTER ANTIBODY, IGG: Varicella zoster IgG: 2139 index (ref >165)

## 2016-10-27 LAB — ABO/RH: Rh Factor: POSITIVE

## 2016-10-27 LAB — CBC
Hematocrit: 42.7 % (ref 34.0–46.6)
Hemoglobin: 14.6 g/dL (ref 11.1–15.9)
MCH: 30.5 pg (ref 26.6–33.0)
MCHC: 34.2 g/dL (ref 31.5–35.7)
MCV: 89 fL (ref 79–97)
Platelets: 229 10*3/uL (ref 150–379)
RBC: 4.79 x10E6/uL (ref 3.77–5.28)
RDW: 14.1 % (ref 12.3–15.4)
WBC: 14.7 10*3/uL — ABNORMAL HIGH (ref 3.4–10.8)

## 2016-10-27 LAB — HEPATITIS B SURFACE ANTIGEN: Hepatitis B Surface Ag: NEGATIVE

## 2016-10-27 LAB — RUBELLA SCREEN: Rubella Antibodies, IGG: 5.13 index (ref >0.99)

## 2016-10-27 LAB — RPR: RPR Ser Ql: NONREACTIVE

## 2016-10-27 LAB — ANTIBODY SCREEN: Antibody Screen: NEGATIVE

## 2016-10-28 LAB — URINE CULTURE

## 2016-11-02 ENCOUNTER — Telehealth: Payer: Self-pay | Admitting: Advanced Practice Midwife

## 2016-11-02 NOTE — Telephone Encounter (Signed)
Pt states she is [redacted] wks pregnant and is having a lot of sinus pain, pressure and drainage and wanting to know what she could take for it.  Advised pt she can only use Tylenol at this point, but can try saline nasal spray, push fluids and sleep with humidifier.  Pt advised to call back if no improvement or starts running a fever.  Pt verbalized understanding.

## 2016-11-03 LAB — CYTOLOGY - PAP
Chlamydia: NEGATIVE
Diagnosis: UNDETERMINED — AB
HPV: NOT DETECTED
Neisseria Gonorrhea: NEGATIVE

## 2016-11-24 ENCOUNTER — Ambulatory Visit (INDEPENDENT_AMBULATORY_CARE_PROVIDER_SITE_OTHER): Payer: Medicaid Other | Admitting: Advanced Practice Midwife

## 2016-11-24 ENCOUNTER — Ambulatory Visit (INDEPENDENT_AMBULATORY_CARE_PROVIDER_SITE_OTHER): Payer: Medicaid Other

## 2016-11-24 ENCOUNTER — Encounter: Payer: Self-pay | Admitting: Advanced Practice Midwife

## 2016-11-24 VITALS — BP 122/80 | HR 72 | Wt 194.0 lb

## 2016-11-24 DIAGNOSIS — O0992 Supervision of high risk pregnancy, unspecified, second trimester: Secondary | ICD-10-CM

## 2016-11-24 DIAGNOSIS — Z1389 Encounter for screening for other disorder: Secondary | ICD-10-CM | POA: Diagnosis not present

## 2016-11-24 DIAGNOSIS — Z3A13 13 weeks gestation of pregnancy: Secondary | ICD-10-CM

## 2016-11-24 DIAGNOSIS — Z331 Pregnant state, incidental: Secondary | ICD-10-CM | POA: Diagnosis not present

## 2016-11-24 DIAGNOSIS — Z3682 Encounter for antenatal screening for nuchal translucency: Secondary | ICD-10-CM | POA: Diagnosis not present

## 2016-11-24 DIAGNOSIS — O09522 Supervision of elderly multigravida, second trimester: Secondary | ICD-10-CM

## 2016-11-24 DIAGNOSIS — Z3A14 14 weeks gestation of pregnancy: Secondary | ICD-10-CM | POA: Diagnosis not present

## 2016-11-24 DIAGNOSIS — Z3481 Encounter for supervision of other normal pregnancy, first trimester: Secondary | ICD-10-CM

## 2016-11-24 LAB — POCT URINALYSIS DIPSTICK
Glucose, UA: NEGATIVE
Ketones, UA: NEGATIVE
Leukocytes, UA: NEGATIVE
Nitrite, UA: NEGATIVE
Protein, UA: NEGATIVE

## 2016-11-24 NOTE — Progress Notes (Signed)
G2P1001 3124w2d Estimated Date of Delivery: 05/30/17  Blood pressure 122/80, pulse 72, weight 194 lb (88 kg), last menstrual period 08/14/2016.   BP weight and urine results all reviewed and noted.  Please refer to the obstetrical flow sheet for the fundal height and fetal heart rate documentation:  NT US today at 13+[redacted] weeks GA.  Single, active fetus with FHR 165 bpm.  CRL measures 76.5 mm and is consistent with dating. NT measures 1.4 mm and nasal bone is present. Bilateral ovaries appear normal.   Patient denies any bleeding and no rupture of membranes symptoms or regular contractions. Patient is without complaints. All questions were answered.  Orders Placed This Encounter  Procedures  . Maternal Screen, Integrated #1  . POCT Urinalysis Dipstick    Plan:  Continued routine obstetrical care,   Return in about 4 weeks (around 12/22/2016) for 2nd IT, LROB.

## 2016-11-24 NOTE — Progress Notes (Signed)
NT US today at 13+[redacted] weeks GA.  Single, active fetus with FHR 165 bpm.  CRL measures 76.5 mm and is consistent with dating. NT measures 1.4 mm and nasal bone is present. Bilateral ovaries appear normal.

## 2016-11-26 LAB — MATERNAL SCREEN, INTEGRATED #1
Crown Rump Length: 76.5 mm
Gest. Age on Collection Date: 13.3 weeks
Maternal Age at EDD: 35.9 years
Nuchal Translucency (NT): 1.4 mm
Number of Fetuses: 1
PAPP-A Value: 1790.2 ng/mL
Weight: 194 [lb_av]

## 2016-12-05 NOTE — L&D Delivery Note (Signed)
Delivery Note At 3:58 PM a non-viable female was delivered via VBAC, Spontaneous  APGAR: 0, 0; weight 1 lb 13.8 oz (845 g).   Placenta status: , .  Cord:  with the following complications: double nuchal cord (tight)  Anesthesia:  epidural Episiotomy: None Lacerations: None Est. Blood Loss (mL): 450 (old clot after placenta; almost no new blood)  Mom to postpartum.  Baby to [facility].  Elsie LincolnKelly Zlatan Hornback 02/25/2017, 8:07 PM

## 2016-12-22 ENCOUNTER — Encounter: Payer: Medicaid Other | Admitting: Advanced Practice Midwife

## 2016-12-28 ENCOUNTER — Encounter: Payer: Medicaid Other | Admitting: Women's Health

## 2016-12-29 ENCOUNTER — Encounter: Payer: Self-pay | Admitting: Women's Health

## 2016-12-29 ENCOUNTER — Ambulatory Visit (INDEPENDENT_AMBULATORY_CARE_PROVIDER_SITE_OTHER): Payer: Medicaid Other | Admitting: Women's Health

## 2016-12-29 VITALS — BP 120/84 | HR 95 | Wt 203.0 lb

## 2016-12-29 DIAGNOSIS — Z3482 Encounter for supervision of other normal pregnancy, second trimester: Secondary | ICD-10-CM

## 2016-12-29 DIAGNOSIS — O99212 Obesity complicating pregnancy, second trimester: Secondary | ICD-10-CM | POA: Diagnosis not present

## 2016-12-29 DIAGNOSIS — O23592 Infection of other part of genital tract in pregnancy, second trimester: Secondary | ICD-10-CM

## 2016-12-29 DIAGNOSIS — O09522 Supervision of elderly multigravida, second trimester: Secondary | ICD-10-CM | POA: Diagnosis not present

## 2016-12-29 DIAGNOSIS — Z3A18 18 weeks gestation of pregnancy: Secondary | ICD-10-CM | POA: Diagnosis not present

## 2016-12-29 DIAGNOSIS — Z1389 Encounter for screening for other disorder: Secondary | ICD-10-CM

## 2016-12-29 DIAGNOSIS — Z363 Encounter for antenatal screening for malformations: Secondary | ICD-10-CM

## 2016-12-29 DIAGNOSIS — Z3682 Encounter for antenatal screening for nuchal translucency: Secondary | ICD-10-CM

## 2016-12-29 DIAGNOSIS — B009 Herpesviral infection, unspecified: Secondary | ICD-10-CM

## 2016-12-29 DIAGNOSIS — Z331 Pregnant state, incidental: Secondary | ICD-10-CM | POA: Diagnosis not present

## 2016-12-29 LAB — POCT URINALYSIS DIPSTICK
Glucose, UA: NEGATIVE
KETONES UA: NEGATIVE
Leukocytes, UA: NEGATIVE
Nitrite, UA: NEGATIVE
PROTEIN UA: NEGATIVE
RBC UA: NEGATIVE

## 2016-12-29 NOTE — Progress Notes (Signed)
Low-risk OB appointment G2P1001 635w2d Estimated Date of Delivery: 05/30/17 BP 120/84   Pulse 95   Wt 203 lb (92.1 kg)   LMP 08/14/2016 (Exact Date)   BMI 39.65 kg/m   BP, weight, and urine reviewed.  Refer to obstetrical flow sheet for FH & FHR.  Reports some fm.  Denies regular uc's, lof, vb, or uti s/s. No complaints. Discussed weight gain, 29lb to date of recommended no more than 20 based on her pre-gravid BMI, recommended decreasing carbs/calories, increasing exercise Reviewed warning s/s to report. Plan:  Continue routine obstetrical care  F/U in 2wks for OB appointment and anatomy u/s 2nd IT today

## 2016-12-29 NOTE — Patient Instructions (Signed)
Second Trimester of Pregnancy The second trimester is from week 13 through week 28 (months 4 through 6). The second trimester is often a time when you feel your best. Your body has also adjusted to being pregnant, and you begin to feel better physically. Usually, morning sickness has lessened or quit completely, you may have more energy, and you may have an increase in appetite. The second trimester is also a time when the fetus is growing rapidly. At the end of the sixth month, the fetus is about 9 inches long and weighs about 1 pounds. You will likely begin to feel the baby move (quickening) between 18 and 20 weeks of the pregnancy. Body changes during your second trimester Your body continues to go through many changes during your second trimester. The changes vary from woman to woman.  Your weight will continue to increase. You will notice your lower abdomen bulging out.  You may begin to get stretch marks on your hips, abdomen, and breasts.  You may develop headaches that can be relieved by medicines. The medicines should be approved by your health care provider.  You may urinate more often because the fetus is pressing on your bladder.  You may develop or continue to have heartburn as a result of your pregnancy.  You may develop constipation because certain hormones are causing the muscles that push waste through your intestines to slow down.  You may develop hemorrhoids or swollen, bulging veins (varicose veins).  You may have back pain. This is caused by:  Weight gain.  Pregnancy hormones that are relaxing the joints in your pelvis.  A shift in weight and the muscles that support your balance.  Your breasts will continue to grow and they will continue to become tender.  Your gums may bleed and may be sensitive to brushing and flossing.  Dark spots or blotches (chloasma, mask of pregnancy) may develop on your face. This will likely fade after the baby is born.  A dark line  from your belly button to the pubic area (linea nigra) may appear. This will likely fade after the baby is born.  You may have changes in your hair. These can include thickening of your hair, rapid growth, and changes in texture. Some women also have hair loss during or after pregnancy, or hair that feels dry or thin. Your hair will most likely return to normal after your baby is born. What to expect at prenatal visits During a routine prenatal visit:  You will be weighed to make sure you and the fetus are growing normally.  Your blood pressure will be taken.  Your abdomen will be measured to track your baby's growth.  The fetal heartbeat will be listened to.  Any test results from the previous visit will be discussed. Your health care provider may ask you:  How you are feeling.  If you are feeling the baby move.  If you have had any abnormal symptoms, such as leaking fluid, bleeding, severe headaches, or abdominal cramping.  If you are using any tobacco products, including cigarettes, chewing tobacco, and electronic cigarettes.  If you have any questions. Other tests that may be performed during your second trimester include:  Blood tests that check for:  Low iron levels (anemia).  Gestational diabetes (between 24 and 28 weeks).  Rh antibodies. This is to check for a protein on red blood cells (Rh factor).  Urine tests to check for infections, diabetes, or protein in the urine.  An ultrasound to   confirm the proper growth and development of the baby.  An amniocentesis to check for possible genetic problems.  Fetal screens for spina bifida and Down syndrome.  HIV (human immunodeficiency virus) testing. Routine prenatal testing includes screening for HIV, unless you choose not to have this test. Follow these instructions at home: Eating and drinking  Continue to eat regular, healthy meals.  Avoid raw meat, uncooked cheese, cat litter boxes, and soil used by cats. These  carry germs that can cause birth defects in the baby.  Take your prenatal vitamins.  Take 1500-2000 mg of calcium daily starting at the 20th week of pregnancy until you deliver your baby.  If you develop constipation:  Take over-the-counter or prescription medicines.  Drink enough fluid to keep your urine clear or pale yellow.  Eat foods that are high in fiber, such as fresh fruits and vegetables, whole grains, and beans.  Limit foods that are high in fat and processed sugars, such as fried and sweet foods. Activity  Exercise only as directed by your health care provider. Experiencing uterine cramps is a good sign to stop exercising.  Avoid heavy lifting, wear low heel shoes, and practice good posture.  Wear your seat belt at all times when driving.  Rest with your legs elevated if you have leg cramps or low back pain.  Wear a good support bra for breast tenderness.  Do not use hot tubs, steam rooms, or saunas. Lifestyle  Avoid all smoking, herbs, alcohol, and unprescribed drugs. These chemicals affect the formation and growth of the baby.  Do not use any products that contain nicotine or tobacco, such as cigarettes and e-cigarettes. If you need help quitting, ask your health care provider.  A sexual relationship may be continued unless your health care provider directs you otherwise. General instructions  Follow your health care provider's instructions regarding medicine use. There are medicines that are either safe or unsafe to take during pregnancy.  Take warm sitz baths to soothe any pain or discomfort caused by hemorrhoids. Use hemorrhoid cream if your health care provider approves.  If you develop varicose veins, wear support hose. Elevate your feet for 15 minutes, 3-4 times a day. Limit salt in your diet.  Visit your dentist if you have not gone yet during your pregnancy. Use a soft toothbrush to brush your teeth and be gentle when you floss.  Keep all follow-up  prenatal visits as told by your health care provider. This is important. Contact a health care provider if:  You have dizziness.  You have mild pelvic cramps, pelvic pressure, or nagging pain in the abdominal area.  You have persistent nausea, vomiting, or diarrhea.  You have a bad smelling vaginal discharge.  You have pain with urination. Get help right away if:  You have a fever.  You are leaking fluid from your vagina.  You have spotting or bleeding from your vagina.  You have severe abdominal cramping or pain.  You have rapid weight gain or weight loss.  You have shortness of breath with chest pain.  You notice sudden or extreme swelling of your face, hands, ankles, feet, or legs.  You have not felt your baby move in over an hour.  You have severe headaches that do not go away with medicine.  You have vision changes. Summary  The second trimester is from week 13 through week 28 (months 4 through 6). It is also a time when the fetus is growing rapidly.  Your body goes   through many changes during pregnancy. The changes vary from woman to woman.  Avoid all smoking, herbs, alcohol, and unprescribed drugs. These chemicals affect the formation and growth your baby.  Do not use any tobacco products, such as cigarettes, chewing tobacco, and e-cigarettes. If you need help quitting, ask your health care provider.  Contact your health care provider if you have any questions. Keep all prenatal visits as told by your health care provider. This is important. This information is not intended to replace advice given to you by your health care provider. Make sure you discuss any questions you have with your health care provider. Document Released: 11/15/2001 Document Revised: 04/28/2016 Document Reviewed: 01/22/2013 [employer] Interactive Patient Education  2017 [employer]  

## 2017-01-04 LAB — MATERNAL SCREEN, INTEGRATED #2
AFP MARKER: 24.8 ng/mL
AFP MoM: 0.71
Crown Rump Length: 76.5 mm
DIA MoM: 1.17
DIA Value: 177.9 pg/mL
ESTRIOL UNCONJUGATED: 0.79 ng/mL
GEST. AGE ON COLLECTION DATE: 13.3 wk
Gestational Age: 18.3 weeks
HCG MOM: 0.78
HCG VALUE: 15.7 [IU]/mL
Maternal Age at EDD: 35.9 years
NUMBER OF FETUSES: 1
Nuchal Translucency (NT): 1.4 mm
Nuchal Translucency MoM: 0.75
PAPP-A MoM: 1.94
PAPP-A Value: 1790.2 ng/mL
Test Results:: NEGATIVE
WEIGHT: 194 [lb_av]
WEIGHT: 194 [lb_av]
uE3 MoM: 0.64

## 2017-01-13 ENCOUNTER — Ambulatory Visit (INDEPENDENT_AMBULATORY_CARE_PROVIDER_SITE_OTHER): Payer: Medicaid Other | Admitting: Obstetrics & Gynecology

## 2017-01-13 ENCOUNTER — Ambulatory Visit (INDEPENDENT_AMBULATORY_CARE_PROVIDER_SITE_OTHER): Payer: Medicaid Other

## 2017-01-13 ENCOUNTER — Encounter: Payer: Self-pay | Admitting: Obstetrics & Gynecology

## 2017-01-13 VITALS — BP 120/80 | HR 72 | Wt 203.0 lb

## 2017-01-13 DIAGNOSIS — Z1389 Encounter for screening for other disorder: Secondary | ICD-10-CM | POA: Diagnosis not present

## 2017-01-13 DIAGNOSIS — Z3A20 20 weeks gestation of pregnancy: Secondary | ICD-10-CM | POA: Diagnosis not present

## 2017-01-13 DIAGNOSIS — Z3402 Encounter for supervision of normal first pregnancy, second trimester: Secondary | ICD-10-CM

## 2017-01-13 DIAGNOSIS — Z331 Pregnant state, incidental: Secondary | ICD-10-CM

## 2017-01-13 DIAGNOSIS — Z3482 Encounter for supervision of other normal pregnancy, second trimester: Secondary | ICD-10-CM

## 2017-01-13 DIAGNOSIS — O321XX1 Maternal care for breech presentation, fetus 1: Secondary | ICD-10-CM

## 2017-01-13 DIAGNOSIS — O99212 Obesity complicating pregnancy, second trimester: Secondary | ICD-10-CM

## 2017-01-13 DIAGNOSIS — O0992 Supervision of high risk pregnancy, unspecified, second trimester: Secondary | ICD-10-CM

## 2017-01-13 DIAGNOSIS — Z98891 History of uterine scar from previous surgery: Secondary | ICD-10-CM

## 2017-01-13 DIAGNOSIS — O09522 Supervision of elderly multigravida, second trimester: Secondary | ICD-10-CM | POA: Diagnosis not present

## 2017-01-13 DIAGNOSIS — Z363 Encounter for antenatal screening for malformations: Secondary | ICD-10-CM | POA: Diagnosis not present

## 2017-01-13 LAB — POCT URINALYSIS DIPSTICK
Blood, UA: NEGATIVE
GLUCOSE UA: NEGATIVE
KETONES UA: NEGATIVE
Leukocytes, UA: NEGATIVE
Nitrite, UA: NEGATIVE
Protein, UA: NEGATIVE

## 2017-01-13 NOTE — Progress Notes (Signed)
US 20+3 wks,breech,ant pl gr 0,normal ov's bilat,cx 4.6 cm,svp of fluid 5.7 cm,fhr 159 bpm,efw 368 g,limited view of heart,please have pt come back for additional images,no obvious abnormalities seen

## 2017-01-13 NOTE — Progress Notes (Signed)
G2P1001 7713w3d Estimated Date of Delivery: 05/30/17  Blood pressure 120/80, pulse 72, weight 203 lb (92.1 kg), last menstrual period 08/14/2016.   BP weight and urine results all reviewed and noted.  Please refer to the obstetrical flow sheet for the fundal height and fetal heart rate documentation:  Patient reports good fetal movement, denies any bleeding and no rupture of membranes symptoms or regular contractions. Patient is without complaints. All questions were answered.  Orders Placed This Encounter  Procedures  . POCT urinalysis dipstick    Plan:  Continued routine obstetrical care, repeat scan at 28 weeks or so  No Follow-up on file.

## 2017-02-10 ENCOUNTER — Ambulatory Visit (INDEPENDENT_AMBULATORY_CARE_PROVIDER_SITE_OTHER): Payer: Medicaid Other | Admitting: Women's Health

## 2017-02-10 ENCOUNTER — Encounter: Payer: Self-pay | Admitting: Women's Health

## 2017-02-10 VITALS — BP 124/82 | HR 92 | Wt 208.0 lb

## 2017-02-10 DIAGNOSIS — O34219 Maternal care for unspecified type scar from previous cesarean delivery: Secondary | ICD-10-CM | POA: Diagnosis not present

## 2017-02-10 DIAGNOSIS — Z1389 Encounter for screening for other disorder: Secondary | ICD-10-CM

## 2017-02-10 DIAGNOSIS — Z331 Pregnant state, incidental: Secondary | ICD-10-CM

## 2017-02-10 DIAGNOSIS — O99212 Obesity complicating pregnancy, second trimester: Secondary | ICD-10-CM

## 2017-02-10 DIAGNOSIS — O0992 Supervision of high risk pregnancy, unspecified, second trimester: Secondary | ICD-10-CM

## 2017-02-10 DIAGNOSIS — Z3A24 24 weeks gestation of pregnancy: Secondary | ICD-10-CM

## 2017-02-10 DIAGNOSIS — O09522 Supervision of elderly multigravida, second trimester: Secondary | ICD-10-CM

## 2017-02-10 DIAGNOSIS — Z363 Encounter for antenatal screening for malformations: Secondary | ICD-10-CM

## 2017-02-10 DIAGNOSIS — Z98891 History of uterine scar from previous surgery: Secondary | ICD-10-CM

## 2017-02-10 DIAGNOSIS — Z3482 Encounter for supervision of other normal pregnancy, second trimester: Secondary | ICD-10-CM

## 2017-02-10 LAB — POCT URINALYSIS DIPSTICK
GLUCOSE UA: NEGATIVE
KETONES UA: NEGATIVE
Leukocytes, UA: NEGATIVE
NITRITE UA: NEGATIVE
Protein, UA: NEGATIVE
RBC UA: NEGATIVE

## 2017-02-10 NOTE — Patient Instructions (Signed)
You will have your sugar test next visit.  Please do not eat or drink anything after midnight the night before you come, not even water.  You will be here for at least two hours.     Call the office ([phone]) or go to [facility] if:  You begin to have strong, frequent contractions  Your water breaks.  Sometimes it is a big gush of fluid, sometimes it is just a trickle that keeps getting your panties wet or running down your legs  You have vaginal bleeding.  It is normal to have a small amount of spotting if your cervix was checked.   You don't feel your baby moving like normal.  If you don't, get you something to eat and drink and lay down and focus on feeling your baby move.   If your baby is still not moving like normal, you should call the office or go to [facility].  Second Trimester of Pregnancy The second trimester is from week 13 through week 28, months 4 through 6. The second trimester is often a time when you feel your best. Your body has also adjusted to being pregnant, and you begin to feel better physically. Usually, morning sickness has lessened or quit completely, you may have more energy, and you may have an increase in appetite. The second trimester is also a time when the fetus is growing rapidly. At the end of the sixth month, the fetus is about 9 inches long and weighs about 1 pounds. You will likely begin to feel the baby move (quickening) between 18 and 20 weeks of the pregnancy. BODY CHANGES Your body goes through many changes during pregnancy. The changes vary from woman to woman.   Your weight will continue to increase. You will notice your lower abdomen bulging out.  You may begin to get stretch marks on your hips, abdomen, and breasts.  You may develop headaches that can be relieved by medicines approved by your health care provider.  You may urinate more often because the fetus is pressing on your bladder.  You may develop or continue to have  heartburn as a result of your pregnancy.  You may develop constipation because certain hormones are causing the muscles that push waste through your intestines to slow down.  You may develop hemorrhoids or swollen, bulging veins (varicose veins).  You may have back pain because of the weight gain and pregnancy hormones relaxing your joints between the bones in your pelvis and as a result of a shift in weight and the muscles that support your balance.  Your breasts will continue to grow and be tender.  Your gums may bleed and may be sensitive to brushing and flossing.  Dark spots or blotches (chloasma, mask of pregnancy) may develop on your face. This will likely fade after the baby is born.  A dark line from your belly button to the pubic area (linea nigra) may appear. This will likely fade after the baby is born.  You may have changes in your hair. These can include thickening of your hair, rapid growth, and changes in texture. Some women also have hair loss during or after pregnancy, or hair that feels dry or thin. Your hair will most likely return to normal after your baby is born. WHAT TO EXPECT AT YOUR PRENATAL VISITS During a routine prenatal visit:  You will be weighed to make sure you and the fetus are growing normally.  Your blood pressure will be taken.    Your abdomen will be measured to track your baby's growth.  The fetal heartbeat will be listened to.  Any test results from the previous visit will be discussed. Your health care provider may ask you:  How you are feeling.  If you are feeling the baby move.  If you have had any abnormal symptoms, such as leaking fluid, bleeding, severe headaches, or abdominal cramping.  If you have any questions. Other tests that may be performed during your second trimester include:  Blood tests that check for:  Low iron levels (anemia).  Gestational diabetes (between 24 and 28 weeks).  Rh antibodies.  Urine tests to check  for infections, diabetes, or protein in the urine.  An ultrasound to confirm the proper growth and development of the baby.  An amniocentesis to check for possible genetic problems.  Fetal screens for spina bifida and Down syndrome. HOME CARE INSTRUCTIONS   Avoid all smoking, herbs, alcohol, and unprescribed drugs. These chemicals affect the formation and growth of the baby.  Follow your health care provider's instructions regarding medicine use. There are medicines that are either safe or unsafe to take during pregnancy.  Exercise only as directed by your health care provider. Experiencing uterine cramps is a good sign to stop exercising.  Continue to eat regular, healthy meals.  Wear a good support bra for breast tenderness.  Do not use hot tubs, steam rooms, or saunas.  Wear your seat belt at all times when driving.  Avoid raw meat, uncooked cheese, cat litter boxes, and soil used by cats. These carry germs that can cause birth defects in the baby.  Take your prenatal vitamins.  Try taking a stool softener (if your health care provider approves) if you develop constipation. Eat more high-fiber foods, such as fresh vegetables or fruit and whole grains. Drink plenty of fluids to keep your urine clear or pale yellow.  Take warm sitz baths to soothe any pain or discomfort caused by hemorrhoids. Use hemorrhoid cream if your health care provider approves.  If you develop varicose veins, wear support hose. Elevate your feet for 15 minutes, 3-4 times a day. Limit salt in your diet.  Avoid heavy lifting, wear low heel shoes, and practice good posture.  Rest with your legs elevated if you have leg cramps or low back pain.  Visit your dentist if you have not gone yet during your pregnancy. Use a soft toothbrush to brush your teeth and be gentle when you floss.  A sexual relationship may be continued unless your health care provider directs you otherwise.  Continue to go to all your  prenatal visits as directed by your health care provider. SEEK MEDICAL CARE IF:   You have dizziness.  You have mild pelvic cramps, pelvic pressure, or nagging pain in the abdominal area.  You have persistent nausea, vomiting, or diarrhea.  You have a bad smelling vaginal discharge.  You have pain with urination. SEEK IMMEDIATE MEDICAL CARE IF:   You have a fever.  You are leaking fluid from your vagina.  You have spotting or bleeding from your vagina.  You have severe abdominal cramping or pain.  You have rapid weight gain or loss.  You have shortness of breath with chest pain.  You notice sudden or extreme swelling of your face, hands, ankles, feet, or legs.  You have not felt your baby move in over an hour.  You have severe headaches that do not go away with medicine.  You have vision changes.   Document Released: 11/15/2001 Document Revised: 11/26/2013 Document Reviewed: 01/22/2013 Exit[employer] Patient Information 2015 [employer]. This information is not intended to replace advice given to you by your health care provider. Make sure you discuss any questions you have with your health care provider.     

## 2017-02-10 NOTE — Progress Notes (Signed)
Low-risk OB appointment G2P1001 939w3d Estimated Date of Delivery: 05/30/17 BP 124/82   Pulse 92   Wt 208 lb (94.3 kg)   LMP 08/14/2016 (Exact Date)   BMI 40.62 kg/m   BP, weight, and urine reviewed.  Refer to obstetrical flow sheet for FH & FHR.  Reports good fm.  Denies regular uc's, lof, vb, or uti s/s. Yeast on chest b/w and under breasts, has on tan colored bra so declines gentian violet, can use otc yeast cream, keep area dry. Hands numb at night- likely CTS, can try wrist splints. Has gained 34lb of recommended no more than 20, pt is aware of this and is planning on changing diet and starting to get out and walk/exercise.  Has decided she wants RLTCS.  Reviewed ptl s/s, fm. Plan:  Continue routine obstetrical care  F/U in 4wks for OB appointment, pn2, and f/u anatomy u/s for limited views heart

## 2017-02-21 ENCOUNTER — Telehealth: Payer: Self-pay | Admitting: Obstetrics & Gynecology

## 2017-02-21 NOTE — Telephone Encounter (Signed)
Pt took 36 y.o. Daughter to ER this a.m. And she was diagnosed with flu and given Tamiflu. Pt is [redacted] weeks pregnant and she would like to know if there are any precautions that she should take.

## 2017-02-24 ENCOUNTER — Inpatient Hospital Stay (HOSPITAL_COMMUNITY)
Admission: AD | Admit: 2017-02-24 | Discharge: 2017-02-26 | DRG: 774 | Disposition: A | Discharge status: Home / Self Care | Payer: Medicaid Other | Source: Ambulatory Visit | Attending: Obstetrics & Gynecology | Admitting: Obstetrics & Gynecology

## 2017-02-24 ENCOUNTER — Encounter (HOSPITAL_COMMUNITY): Payer: Self-pay | Admitting: *Deleted

## 2017-02-24 ENCOUNTER — Encounter (HOSPITAL_COMMUNITY): Payer: Self-pay | Admitting: Certified Registered Nurse Anesthetist

## 2017-02-24 ENCOUNTER — Inpatient Hospital Stay (HOSPITAL_COMMUNITY): Payer: Medicaid Other

## 2017-02-24 ENCOUNTER — Telehealth: Payer: Self-pay | Admitting: Obstetrics & Gynecology

## 2017-02-24 DIAGNOSIS — Z3A26 26 weeks gestation of pregnancy: Secondary | ICD-10-CM

## 2017-02-24 DIAGNOSIS — Z8249 Family history of ischemic heart disease and other diseases of the circulatory system: Secondary | ICD-10-CM | POA: Diagnosis not present

## 2017-02-24 DIAGNOSIS — IMO0002 Reserved for concepts with insufficient information to code with codable children: Secondary | ICD-10-CM

## 2017-02-24 DIAGNOSIS — O4590 Premature separation of placenta, unspecified, unspecified trimester: Secondary | ICD-10-CM

## 2017-02-24 DIAGNOSIS — Z3402 Encounter for supervision of normal first pregnancy, second trimester: Secondary | ICD-10-CM

## 2017-02-24 DIAGNOSIS — O9832 Other infections with a predominantly sexual mode of transmission complicating childbirth: Secondary | ICD-10-CM | POA: Diagnosis present

## 2017-02-24 DIAGNOSIS — O99214 Obesity complicating childbirth: Secondary | ICD-10-CM | POA: Diagnosis present

## 2017-02-24 DIAGNOSIS — R1033 Periumbilical pain: Secondary | ICD-10-CM

## 2017-02-24 DIAGNOSIS — O364XX0 Maternal care for intrauterine death, not applicable or unspecified: Secondary | ICD-10-CM | POA: Diagnosis present

## 2017-02-24 DIAGNOSIS — O09522 Supervision of elderly multigravida, second trimester: Secondary | ICD-10-CM

## 2017-02-24 DIAGNOSIS — R1030 Lower abdominal pain, unspecified: Secondary | ICD-10-CM | POA: Diagnosis present

## 2017-02-24 DIAGNOSIS — O4592 Premature separation of placenta, unspecified, second trimester: Secondary | ICD-10-CM | POA: Diagnosis present

## 2017-02-24 DIAGNOSIS — Z98891 History of uterine scar from previous surgery: Secondary | ICD-10-CM

## 2017-02-24 DIAGNOSIS — A6000 Herpesviral infection of urogenital system, unspecified: Secondary | ICD-10-CM | POA: Diagnosis present

## 2017-02-24 DIAGNOSIS — O458X3 Other premature separation of placenta, third trimester: Secondary | ICD-10-CM | POA: Diagnosis not present

## 2017-02-24 DIAGNOSIS — Z6841 Body Mass Index (BMI) 40.0 and over, adult: Secondary | ICD-10-CM

## 2017-02-24 DIAGNOSIS — O34211 Maternal care for low transverse scar from previous cesarean delivery: Secondary | ICD-10-CM | POA: Diagnosis not present

## 2017-02-24 LAB — COMPREHENSIVE METABOLIC PANEL
ALT: 41 U/L (ref 14–54)
AST: 35 U/L (ref 15–41)
Albumin: 3.2 g/dL — ABNORMAL LOW (ref 3.5–5.0)
Alkaline Phosphatase: 126 U/L (ref 38–126)
Anion gap: 12 (ref 5–15)
BUN: 18 mg/dL (ref 6–20)
CHLORIDE: 105 mmol/L (ref 101–111)
CO2: 20 mmol/L — AB (ref 22–32)
CREATININE: 1.06 mg/dL — AB (ref 0.44–1.00)
Calcium: 8.9 mg/dL (ref 8.9–10.3)
GFR calc Af Amer: >60 mL/min (ref >60)
GLUCOSE: 115 mg/dL — AB (ref 65–99)
POTASSIUM: 4.5 mmol/L (ref 3.5–5.1)
SODIUM: 137 mmol/L (ref 135–145)
Total Bilirubin: 0.7 mg/dL (ref 0.3–1.2)
Total Protein: 6.4 g/dL — ABNORMAL LOW (ref 6.5–8.1)

## 2017-02-24 LAB — DIC (DISSEMINATED INTRAVASCULAR COAGULATION) PANEL
APTT: 30 s (ref 24–36)
INR: 1.16

## 2017-02-24 LAB — CBC
HEMATOCRIT: 34.1 % — AB (ref 36.0–46.0)
Hemoglobin: 11.9 g/dL — ABNORMAL LOW (ref 12.0–15.0)
MCH: 30.8 pg (ref 26.0–34.0)
MCHC: 34.9 g/dL (ref 30.0–36.0)
MCV: 88.3 fL (ref 78.0–100.0)
Platelets: 120 10*3/uL — ABNORMAL LOW (ref 150–400)
RBC: 3.86 MIL/uL — ABNORMAL LOW (ref 3.87–5.11)
RDW: 13.6 % (ref 11.5–15.5)
WBC: 27 10*3/uL — ABNORMAL HIGH (ref 4.0–10.5)

## 2017-02-24 LAB — DIC (DISSEMINATED INTRAVASCULAR COAGULATION)PANEL
D-Dimer, Quant: >20 ug/mL-FEU — ABNORMAL HIGH (ref 0.00–0.50)
Fibrinogen: 117 mg/dL — ABNORMAL LOW (ref 210–475)
Platelets: 120 10*3/uL — ABNORMAL LOW (ref 150–400)
Prothrombin Time: 14.8 seconds (ref 11.4–15.2)
Smear Review: NONE SEEN

## 2017-02-24 LAB — RAPID URINE DRUG SCREEN, HOSP PERFORMED
AMPHETAMINES: NOT DETECTED
Barbiturates: NOT DETECTED
Benzodiazepines: NOT DETECTED
Cocaine: NOT DETECTED
Opiates: NOT DETECTED
Tetrahydrocannabinol: NOT DETECTED

## 2017-02-24 LAB — KLEIHAUER-BETKE STAIN
# Vials RhIg: 1
Fetal Cells %: 0 %
QUANTITATION FETAL HEMOGLOBIN: 0 mL

## 2017-02-24 MED ORDER — FENTANYL CITRATE (PF) 100 MCG/2ML IJ SOLN
100.0000 ug | INTRAMUSCULAR | Status: DC | PRN
  Administered 2017-02-24 (×2): 100 ug via INTRAVENOUS
  Filled 2017-02-24 (×2): qty 2

## 2017-02-24 MED ORDER — OXYCODONE-ACETAMINOPHEN 5-325 MG PO TABS: 1.0000 | ORAL_TABLET | ORAL | Status: DC | PRN

## 2017-02-24 MED ORDER — OXYCODONE-ACETAMINOPHEN 5-325 MG PO TABS: 2.0000 | ORAL_TABLET | ORAL | Status: DC | PRN

## 2017-02-24 MED ORDER — PHENYLEPHRINE 40 MCG/ML (10ML) SYRINGE FOR IV PUSH (FOR BLOOD PRESSURE SUPPORT)
80.0000 ug | PREFILLED_SYRINGE | INTRAVENOUS | Status: DC | PRN
  Filled 2017-02-24: qty 10
  Filled 2017-02-24: qty 5

## 2017-02-24 MED ORDER — FLEET ENEMA 7-19 GM/118ML RE ENEM: 1.0000 | ENEMA | RECTAL | Status: DC | PRN

## 2017-02-24 MED ORDER — PHENYLEPHRINE 40 MCG/ML (10ML) SYRINGE FOR IV PUSH (FOR BLOOD PRESSURE SUPPORT)
80.0000 ug | PREFILLED_SYRINGE | INTRAVENOUS | Status: DC | PRN
  Filled 2017-02-24: qty 5

## 2017-02-24 MED ORDER — ACETAMINOPHEN 325 MG PO TABS
650.0000 mg | ORAL_TABLET | ORAL | Status: DC | PRN
  Administered 2017-02-25: 500 mg via ORAL

## 2017-02-24 MED ORDER — ZOLPIDEM TARTRATE 5 MG PO TABS: 5.0000 mg | ORAL_TABLET | Freq: Every day | ORAL | Status: DC

## 2017-02-24 MED ORDER — SOD CITRATE-CITRIC ACID 500-334 MG/5ML PO SOLN: 30.0000 mL | ORAL | Status: DC | PRN

## 2017-02-24 MED ORDER — EPHEDRINE 5 MG/ML INJ
10.0000 mg | INTRAVENOUS | Status: DC | PRN
  Filled 2017-02-24: qty 2

## 2017-02-24 MED ORDER — PROMETHAZINE HCL 25 MG/ML IJ SOLN
25.0000 mg | Freq: Four times a day (QID) | INTRAMUSCULAR | Status: DC | PRN
  Administered 2017-02-24: 25 mg via INTRAVENOUS
  Filled 2017-02-24: qty 1

## 2017-02-24 MED ORDER — LACTATED RINGERS IV SOLN
500.0000 mL | INTRAVENOUS | Status: DC | PRN
  Administered 2017-02-25: 500 mL via INTRAVENOUS

## 2017-02-24 MED ORDER — BUTORPHANOL TARTRATE 1 MG/ML IJ SOLN
2.0000 mg | INTRAMUSCULAR | Status: DC | PRN
  Administered 2017-02-24: 2 mg via INTRAVENOUS
  Filled 2017-02-24 (×2): qty 2

## 2017-02-24 MED ORDER — OXYTOCIN BOLUS FROM INFUSION: 500.0000 mL | Freq: Once | INTRAVENOUS | Status: DC

## 2017-02-24 MED ORDER — FENTANYL 2.5 MCG/ML BUPIVACAINE 1/10 % EPIDURAL INFUSION (WH - ANES)
14.0000 mL/h | INTRAMUSCULAR | Status: DC | PRN
  Administered 2017-02-25 (×2): 14 mL/h via EPIDURAL
  Filled 2017-02-24 (×3): qty 100

## 2017-02-24 MED ORDER — MISOPROSTOL 50MCG HALF TABLET
50.0000 ug | ORAL_TABLET | Freq: Once | ORAL | Status: AC
  Administered 2017-02-24: 50 ug via ORAL
  Filled 2017-02-24: qty 1

## 2017-02-24 MED ORDER — OXYTOCIN 40 UNITS IN LACTATED RINGERS INFUSION - SIMPLE MED
2.5000 [IU]/h | INTRAVENOUS | Status: DC
  Filled 2017-02-24: qty 1000

## 2017-02-24 MED ORDER — LACTATED RINGERS IV SOLN
INTRAVENOUS | Status: DC
  Administered 2017-02-24 – 2017-02-25 (×4): via INTRAVENOUS

## 2017-02-24 MED ORDER — LACTATED RINGERS IV SOLN
500.0000 mL | Freq: Once | INTRAVENOUS | Status: AC
  Administered 2017-02-25: 500 mL via INTRAVENOUS

## 2017-02-24 MED ORDER — LIDOCAINE HCL (PF) 1 % IJ SOLN
30.0000 mL | INTRAMUSCULAR | Status: DC | PRN
  Filled 2017-02-24: qty 30

## 2017-02-24 MED ORDER — ONDANSETRON HCL 4 MG/2ML IJ SOLN
4.0000 mg | Freq: Four times a day (QID) | INTRAMUSCULAR | Status: DC | PRN
  Administered 2017-02-24: 4 mg via INTRAVENOUS
  Filled 2017-02-24: qty 2

## 2017-02-24 MED ORDER — DIPHENHYDRAMINE HCL 50 MG/ML IJ SOLN: 12.5000 mg | INTRAMUSCULAR | Status: DC | PRN

## 2017-02-24 NOTE — Progress Notes (Signed)
Pt emotional and crying after finding out IUFD, transferred to BS rm 161, report given to Darrow BussingLiz Poore, rn

## 2017-02-24 NOTE — Telephone Encounter (Signed)
Pt called stating that she has been dealing with a child that has the flu, she state that she has been having using the restroom a lot and has been throwing up. Please contact pt

## 2017-02-24 NOTE — MAU Note (Signed)
Patients states this morning when she got up at 5am having lower abdominal pain that is constant and loose stool. Also started vomiting "bile" this am. Tried soaking in the tub for the pain. States unable to hold tylenol, food or drink down. At 530 tried tylenol again and was able to hold down. Reports no fetal movement today.

## 2017-02-24 NOTE — H&P (Signed)
Sheila Cline is a 36 y.o. female presenting for Lower abdominal pain and loose stools.  Also had some vomiting.  Also reports no fetal movement today. . RN Note: Patients states this morning when she got up at 5am having lower abdominal pain that is constant and loose stool. Also started vomiting "bile" this am. Tried soaking in the tub for the pain. States unable to hold tylenol, food or drink down. At 530 tried tylenol again and was able to hold down. Reports no fetal movement today.   OB History    Gravida Para Term Preterm AB Living   2 1 1     1    SAB TAB Ectopic Multiple Live Births           1     Past Medical History:  Diagnosis Date  . Abnormal pap   . Chlamydia   . Dysplasia of cervix, low grade (CIN 1)   . HPV (human papilloma virus) infection   . HSV-2 (herpes simplex virus 2) infection   . Interstitial cystitis   . OAB (overactive bladder) 09/24/2013  . Stress at home 09/24/2013   Past Surgical History:  Procedure Laterality Date  . CESAREAN SECTION    . tubes in ears     Family History: family history includes Asthma in her brother, daughter, and mother; Cancer in her maternal aunt; Heart disease in her maternal grandmother; Hypertension in her maternal grandfather and mother; Interstitial cystitis in her maternal grandmother; Stroke in her maternal grandmother. Social History:  reports that she has never smoked. She has never used smokeless tobacco. She reports that she does not drink alcohol or use drugs.     Maternal Diabetes: No Genetic Screening: Declined Maternal Ultrasounds/Referrals: Normal Fetal Ultrasounds or other Referrals:  None Maternal Substance Abuse:  No Significant Maternal Medications:  None Significant Maternal Lab Results:  None Other Comments:  None  Review of Systems  Constitutional: Negative for chills, fever and malaise/fatigue.  Eyes: Negative for blurred vision.  Respiratory: Negative for shortness of breath.   Cardiovascular:  Negative for leg swelling.  Gastrointestinal: Positive for abdominal pain. Negative for constipation, diarrhea, nausea and vomiting.  Musculoskeletal: Negative for back pain.  Neurological: Negative for dizziness and headaches.   Maternal Medical History:  Reason for admission: Nausea. Abdominal pain, vomiting  Fetal activity: Perceived fetal activity is decreased.    Prenatal complications: No bleeding, PIH, oligohydramnios, pre-eclampsia or preterm labor.   Prenatal Complications - Diabetes: none.      Blood pressure 134/80, pulse 74, temperature 98.9 F (37.2 C), temperature source Oral, resp. rate 18, weight 212 lb 0.6 oz (96.2 kg), last menstrual period 08/14/2016, SpO2 100 %. Maternal Exam:  Uterine Assessment: Contraction strength is mild.  Contraction frequency is rare.   Abdomen: Patient reports generalized tenderness.  Surgical scars: low transverse.   Fundal height is 26.    Introitus: Ferning test: not done.  Nitrazine test: not done. Amniotic fluid character: not assessed.     Fetal Exam Fetal Monitor Review: Mode: ultrasound.   Baseline rate: 0.      Physical Exam  Constitutional: She is oriented to person, place, and time. She appears well-developed and well-nourished. No distress.  HENT:  Head: Normocephalic.  Cardiovascular: Normal rate and regular rhythm.   Respiratory: Effort normal. No respiratory distress. She has no wheezes. She has no rales.  GI: Soft. She exhibits no distension. There is generalized tenderness. There is no rebound and no guarding.  Musculoskeletal: Normal  range of motion.  Neurological: She is alert and oriented to person, place, and time.  Skin: Skin is warm and dry.  Psychiatric: She has a normal mood and affect.    Bedside US done which confirmed no fetal heartbeat and probable placental abruption  Prenatal labs: ABO, Rh: A/Positive/-- (11/22 1003) Antibody: Negative (11/22 1003) Rubella: 5.13 (11/22 1003) RPR: Non  Reactive (11/22 1003)  HBsAg: Negative (11/22 1003)  HIV: Non Reactive (11/22 1003)  GBS:     Assessment/Plan: Single IUP at 7668w3d Intrauterine fetal demise  Dr Debroah LoopArnold in to speak with parents Admit  Routine orders Will add K-B, DIC, Lupus, and anticardiolipin antibodies    Sheila BourgeoisMarie Cline 02/24/2017, 7:57 PM

## 2017-02-24 NOTE — Telephone Encounter (Signed)
Pt c/o diarrhea and one episode of vomiting. Having some lower stomach pains as well but no fever. Instructed patient to rest, drink plenty of fluids, and to try the BRAT diet to help with the diarrhea. Instructed to call back if symptoms persist or worsen. Pt verbalized understanding.

## 2017-02-24 NOTE — Anesthesia Pain Management Evaluation Note (Signed)
  CRNA Pain Management Visit Note  Patient: Sheila Cline, 36 y.o., female  "Hello I am a [facility] the anesthesia [facility]. We have an anesthesia team available at all times to provide care throughout the hospital, including epidural management and anesthesia for C-section. I don't know your plan for the delivery whether it a natural birth, water birth, IV sedation, nitrous supplementation, doula or epidural, but we want to meet your pain goals."   1.Was your pain managed to your expectations on prior hospitalizations?   Yes   2.What is your expectation for pain management during this hospitalization?     Epidural  3.How can we help you reach that goal? Epidural when labs result and patient ready.  Record the patient's initial score and the patient's pain goal.   Pain: 10--L&D RN at bedside and aware.  Pain Goal: 6 [facility] wants you to be able to say your pain was always managed very well.  Juanluis Guastella L 02/24/2017

## 2017-02-25 ENCOUNTER — Inpatient Hospital Stay (HOSPITAL_COMMUNITY): Payer: Medicaid Other | Admitting: Anesthesiology

## 2017-02-25 ENCOUNTER — Encounter (HOSPITAL_COMMUNITY): Payer: Self-pay

## 2017-02-25 ENCOUNTER — Inpatient Hospital Stay (HOSPITAL_COMMUNITY): Payer: Medicaid Other

## 2017-02-25 DIAGNOSIS — Z3A26 26 weeks gestation of pregnancy: Secondary | ICD-10-CM

## 2017-02-25 DIAGNOSIS — O458X3 Other premature separation of placenta, third trimester: Secondary | ICD-10-CM

## 2017-02-25 DIAGNOSIS — O34211 Maternal care for low transverse scar from previous cesarean delivery: Secondary | ICD-10-CM

## 2017-02-25 DIAGNOSIS — O364XX0 Maternal care for intrauterine death, not applicable or unspecified: Secondary | ICD-10-CM

## 2017-02-25 LAB — COMPREHENSIVE METABOLIC PANEL
ALBUMIN: 2.7 g/dL — AB (ref 3.5–5.0)
ALBUMIN: 2.5 g/dL — AB (ref 3.5–5.0)
ALBUMIN: 2.4 g/dL — AB (ref 3.5–5.0)
ALK PHOS: 140 U/L — AB (ref 38–126)
ALK PHOS: 139 U/L — AB (ref 38–126)
ALK PHOS: 113 U/L (ref 38–126)
ALT: 32 U/L (ref 14–54)
ALT: 31 U/L (ref 14–54)
ALT: 27 U/L (ref 14–54)
ALT: 26 U/L (ref 14–54)
ALT: 24 U/L (ref 14–54)
ANION GAP: 7 (ref 5–15)
ANION GAP: 8 (ref 5–15)
ANION GAP: 9 (ref 5–15)
AST: 26 U/L (ref 15–41)
AST: 26 U/L (ref 15–41)
AST: 22 U/L (ref 15–41)
AST: 25 U/L (ref 15–41)
AST: 24 U/L (ref 15–41)
Albumin: 2.9 g/dL — ABNORMAL LOW (ref 3.5–5.0)
Albumin: 2.7 g/dL — ABNORMAL LOW (ref 3.5–5.0)
Alkaline Phosphatase: 122 U/L (ref 38–126)
Alkaline Phosphatase: 125 U/L (ref 38–126)
Anion gap: 7 (ref 5–15)
Anion gap: 7 (ref 5–15)
BILIRUBIN TOTAL: 0.7 mg/dL (ref 0.3–1.2)
BILIRUBIN TOTAL: 0.9 mg/dL (ref 0.3–1.2)
BILIRUBIN TOTAL: 0.5 mg/dL (ref 0.3–1.2)
BUN: 23 mg/dL — AB (ref 6–20)
BUN: 24 mg/dL — ABNORMAL HIGH (ref 6–20)
BUN: 25 mg/dL — ABNORMAL HIGH (ref 6–20)
BUN: 25 mg/dL — AB (ref 6–20)
BUN: 23 mg/dL — AB (ref 6–20)
CALCIUM: 8.5 mg/dL — AB (ref 8.9–10.3)
CALCIUM: 8.3 mg/dL — AB (ref 8.9–10.3)
CHLORIDE: 106 mmol/L (ref 101–111)
CHLORIDE: 106 mmol/L (ref 101–111)
CO2: 23 mmol/L (ref 22–32)
CO2: 23 mmol/L (ref 22–32)
CO2: 23 mmol/L (ref 22–32)
CO2: 21 mmol/L — ABNORMAL LOW (ref 22–32)
CO2: 22 mmol/L (ref 22–32)
CREATININE: 1.32 mg/dL — AB (ref 0.44–1.00)
Calcium: 8.1 mg/dL — ABNORMAL LOW (ref 8.9–10.3)
Calcium: 8 mg/dL — ABNORMAL LOW (ref 8.9–10.3)
Calcium: 7.7 mg/dL — ABNORMAL LOW (ref 8.9–10.3)
Chloride: 107 mmol/L (ref 101–111)
Chloride: 106 mmol/L (ref 101–111)
Chloride: 107 mmol/L (ref 101–111)
Creatinine, Ser: 1.24 mg/dL — ABNORMAL HIGH (ref 0.44–1.00)
Creatinine, Ser: 1.61 mg/dL — ABNORMAL HIGH (ref 0.44–1.00)
Creatinine, Ser: 1.35 mg/dL — ABNORMAL HIGH (ref 0.44–1.00)
Creatinine, Ser: 1.29 mg/dL — ABNORMAL HIGH (ref 0.44–1.00)
GFR calc Af Amer: 60 mL/min — ABNORMAL LOW (ref >60)
GFR calc Af Amer: 58 mL/min — ABNORMAL LOW (ref >60)
GFR calc Af Amer: >60 mL/min (ref >60)
GFR calc non Af Amer: 52 mL/min — ABNORMAL LOW (ref >60)
GFR calc non Af Amer: 56 mL/min — ABNORMAL LOW (ref >60)
GFR calc non Af Amer: 50 mL/min — ABNORMAL LOW (ref >60)
GFR calc non Af Amer: 53 mL/min — ABNORMAL LOW (ref >60)
GFR, EST AFRICAN AMERICAN: 47 mL/min — AB (ref >60)
GFR, EST NON AFRICAN AMERICAN: 41 mL/min — AB (ref >60)
GLUCOSE: 105 mg/dL — AB (ref 65–99)
GLUCOSE: 110 mg/dL — AB (ref 65–99)
GLUCOSE: 126 mg/dL — AB (ref 65–99)
Glucose, Bld: 97 mg/dL (ref 65–99)
Glucose, Bld: 104 mg/dL — ABNORMAL HIGH (ref 65–99)
POTASSIUM: 3.4 mmol/L — AB (ref 3.5–5.1)
POTASSIUM: 3.3 mmol/L — AB (ref 3.5–5.1)
Potassium: 4.4 mmol/L (ref 3.5–5.1)
Potassium: 4.1 mmol/L (ref 3.5–5.1)
Potassium: 3.8 mmol/L (ref 3.5–5.1)
SODIUM: 137 mmol/L (ref 135–145)
SODIUM: 136 mmol/L (ref 135–145)
SODIUM: 136 mmol/L (ref 135–145)
Sodium: 137 mmol/L (ref 135–145)
Sodium: 136 mmol/L (ref 135–145)
TOTAL PROTEIN: 5.6 g/dL — AB (ref 6.5–8.1)
TOTAL PROTEIN: 5.5 g/dL — AB (ref 6.5–8.1)
TOTAL PROTEIN: 4.8 g/dL — AB (ref 6.5–8.1)
Total Bilirubin: 0.6 mg/dL (ref 0.3–1.2)
Total Bilirubin: 0.9 mg/dL (ref 0.3–1.2)
Total Protein: 5.6 g/dL — ABNORMAL LOW (ref 6.5–8.1)
Total Protein: 5.1 g/dL — ABNORMAL LOW (ref 6.5–8.1)

## 2017-02-25 LAB — CBC
HCT: 28.7 % — ABNORMAL LOW (ref 36.0–46.0)
HEMATOCRIT: 29.5 % — AB (ref 36.0–46.0)
HEMATOCRIT: 26.6 % — AB (ref 36.0–46.0)
HEMATOCRIT: 26.6 % — AB (ref 36.0–46.0)
HEMOGLOBIN: 10.3 g/dL — AB (ref 12.0–15.0)
HEMOGLOBIN: 9.2 g/dL — AB (ref 12.0–15.0)
Hemoglobin: 10 g/dL — ABNORMAL LOW (ref 12.0–15.0)
Hemoglobin: 9.4 g/dL — ABNORMAL LOW (ref 12.0–15.0)
MCH: 31.4 pg (ref 26.0–34.0)
MCH: 31.3 pg (ref 26.0–34.0)
MCH: 31.3 pg (ref 26.0–34.0)
MCH: 31.6 pg (ref 26.0–34.0)
MCHC: 34.9 g/dL (ref 30.0–36.0)
MCHC: 34.8 g/dL (ref 30.0–36.0)
MCHC: 34.6 g/dL (ref 30.0–36.0)
MCHC: 35.3 g/dL (ref 30.0–36.0)
MCV: 89.9 fL (ref 78.0–100.0)
MCV: 90 fL (ref 78.0–100.0)
MCV: 90.5 fL (ref 78.0–100.0)
MCV: 89.6 fL (ref 78.0–100.0)
PLATELETS: 96 10*3/uL — AB (ref 150–400)
Platelets: 99 10*3/uL — ABNORMAL LOW (ref 150–400)
Platelets: 88 10*3/uL — ABNORMAL LOW (ref 150–400)
Platelets: 88 10*3/uL — ABNORMAL LOW (ref 150–400)
RBC: 3.28 MIL/uL — AB (ref 3.87–5.11)
RBC: 3.19 MIL/uL — ABNORMAL LOW (ref 3.87–5.11)
RBC: 2.94 MIL/uL — ABNORMAL LOW (ref 3.87–5.11)
RBC: 2.97 MIL/uL — ABNORMAL LOW (ref 3.87–5.11)
RDW: 14 % (ref 11.5–15.5)
RDW: 13.8 % (ref 11.5–15.5)
RDW: 13.9 % (ref 11.5–15.5)
RDW: 13.9 % (ref 11.5–15.5)
WBC: 22.7 10*3/uL — ABNORMAL HIGH (ref 4.0–10.5)
WBC: 24.8 10*3/uL — ABNORMAL HIGH (ref 4.0–10.5)
WBC: 23.4 10*3/uL — ABNORMAL HIGH (ref 4.0–10.5)
WBC: 23.2 10*3/uL — ABNORMAL HIGH (ref 4.0–10.5)

## 2017-02-25 LAB — DIC (DISSEMINATED INTRAVASCULAR COAGULATION) PANEL
APTT: 23 s — AB (ref 24–36)
APTT: 30 s (ref 24–36)
APTT: 33 s (ref 24–36)
FIBRINOGEN: 187 mg/dL — AB (ref 210–475)
FIBRINOGEN: 189 mg/dL — AB (ref 210–475)
INR: 1.12
PLATELETS: 98 10*3/uL — AB (ref 150–400)
PROTHROMBIN TIME: 14.5 s (ref 11.4–15.2)
PROTHROMBIN TIME: 14.9 s (ref 11.4–15.2)
PROTHROMBIN TIME: 14.5 s (ref 11.4–15.2)
SMEAR REVIEW: NONE SEEN
SMEAR REVIEW: NONE SEEN

## 2017-02-25 LAB — DIC (DISSEMINATED INTRAVASCULAR COAGULATION)PANEL
D-Dimer, Quant: >20 ug/mL-FEU — ABNORMAL HIGH (ref 0.00–0.50)
D-Dimer, Quant: >20 ug/mL-FEU — ABNORMAL HIGH (ref 0.00–0.50)
Fibrinogen: 126 mg/dL — ABNORMAL LOW (ref 210–475)
Fibrinogen: 194 mg/dL — ABNORMAL LOW (ref 210–475)
INR: 1.11
INR: 1.16
INR: 1.12
Platelets: 87 10*3/uL — ABNORMAL LOW (ref 150–400)
Platelets: 90 10*3/uL — ABNORMAL LOW (ref 150–400)
Platelets: 82 10*3/uL — ABNORMAL LOW (ref 150–400)
Prothrombin Time: 14.3 seconds (ref 11.4–15.2)
Smear Review: NONE SEEN
Smear Review: NONE SEEN
aPTT: 35 seconds (ref 24–36)

## 2017-02-25 LAB — PROTIME-INR
INR: 1.12
Prothrombin Time: 14.4 seconds (ref 11.4–15.2)

## 2017-02-25 LAB — FIBRINOGEN: Fibrinogen: 156 mg/dL — ABNORMAL LOW (ref 210–475)

## 2017-02-25 LAB — PLATELET COUNT: PLATELETS: 91 10*3/uL — AB (ref 150–400)

## 2017-02-25 LAB — APTT: aPTT: 24 seconds (ref 24–36)

## 2017-02-25 LAB — ABO/RH: ABO/RH(D): A POS

## 2017-02-25 LAB — RPR: RPR: NONREACTIVE

## 2017-02-25 LAB — PREPARE RBC (CROSSMATCH)

## 2017-02-25 LAB — SAVE SMEAR: SMEAR REVIEW: NONE SEEN

## 2017-02-25 MED ORDER — MISOPROSTOL 200 MCG PO TABS
ORAL_TABLET | ORAL | Status: AC
  Filled 2017-02-25: qty 4

## 2017-02-25 MED ORDER — CARBOPROST TROMETHAMINE 250 MCG/ML IM SOLN
INTRAMUSCULAR | Status: AC
  Filled 2017-02-25: qty 1

## 2017-02-25 MED ORDER — MISOPROSTOL 50MCG HALF TABLET
50.0000 ug | ORAL_TABLET | ORAL | Status: DC
  Administered 2017-02-25: 50 ug via ORAL
  Filled 2017-02-25 (×2): qty 1

## 2017-02-25 MED ORDER — SENNOSIDES-DOCUSATE SODIUM 8.6-50 MG PO TABS
2.0000 | ORAL_TABLET | ORAL | Status: DC
  Administered 2017-02-25: 2 via ORAL
  Filled 2017-02-25: qty 2

## 2017-02-25 MED ORDER — SODIUM CHLORIDE 0.9 % IV SOLN: 250.0000 mL | INTRAVENOUS | Status: DC | PRN

## 2017-02-25 MED ORDER — LACTATED RINGERS IV BOLUS (SEPSIS)
1000.0000 mL | Freq: Once | INTRAVENOUS | Status: AC
  Administered 2017-02-25: 1000 mL via INTRAVENOUS

## 2017-02-25 MED ORDER — SODIUM CHLORIDE 0.9 % IV SOLN: Freq: Once | INTRAVENOUS | Status: DC

## 2017-02-25 MED ORDER — OXYTOCIN 40 UNITS IN LACTATED RINGERS INFUSION - SIMPLE MED
1.0000 m[IU]/min | INTRAVENOUS | Status: DC
  Administered 2017-02-25: 2 m[IU]/min via INTRAVENOUS
  Administered 2017-02-25: 5 m[IU]/min via INTRAVENOUS

## 2017-02-25 MED ORDER — LIDOCAINE HCL (PF) 1 % IJ SOLN
INTRAMUSCULAR | Status: DC | PRN
  Administered 2017-02-25 (×2): 5 mL

## 2017-02-25 MED ORDER — DIPHENHYDRAMINE HCL 25 MG PO CAPS: 25.0000 mg | ORAL_CAPSULE | Freq: Four times a day (QID) | ORAL | Status: DC | PRN

## 2017-02-25 MED ORDER — ZOLPIDEM TARTRATE 5 MG PO TABS: 5.0000 mg | ORAL_TABLET | Freq: Every evening | ORAL | Status: DC | PRN

## 2017-02-25 MED ORDER — PIPERACILLIN-TAZOBACTAM 3.375 G IVPB
3.3750 g | Freq: Three times a day (TID) | INTRAVENOUS | Status: AC
  Administered 2017-02-26: 3.375 g via INTRAVENOUS
  Filled 2017-02-25: qty 50

## 2017-02-25 MED ORDER — PIPERACILLIN-TAZOBACTAM 3.375 G IVPB
3.3750 g | Freq: Three times a day (TID) | INTRAVENOUS | Status: DC
  Administered 2017-02-25 (×2): 3.375 g via INTRAVENOUS
  Filled 2017-02-25 (×2): qty 50

## 2017-02-25 MED ORDER — DIBUCAINE 1 % RE OINT: 1.0000 "application " | TOPICAL_OINTMENT | RECTAL | Status: DC | PRN

## 2017-02-25 MED ORDER — LACTATED RINGERS IV BOLUS (SEPSIS)
1000.0000 mL | Freq: Once | INTRAVENOUS | Status: AC
  Administered 2017-02-25: 400 mL via INTRAVENOUS
  Administered 2017-02-25: 600 mL via INTRAVENOUS

## 2017-02-25 MED ORDER — SODIUM CHLORIDE 0.9% FLUSH: 3.0000 mL | Freq: Two times a day (BID) | INTRAVENOUS | Status: DC

## 2017-02-25 MED ORDER — IBUPROFEN 600 MG PO TABS: 600.0000 mg | ORAL_TABLET | Freq: Four times a day (QID) | ORAL | Status: DC

## 2017-02-25 MED ORDER — WITCH HAZEL-GLYCERIN EX PADS: 1.0000 "application " | MEDICATED_PAD | CUTANEOUS | Status: DC | PRN

## 2017-02-25 MED ORDER — ACETAMINOPHEN 500 MG PO TABS
1000.0000 mg | ORAL_TABLET | Freq: Once | ORAL | Status: AC
  Administered 2017-02-25: 1000 mg via ORAL
  Filled 2017-02-25: qty 2

## 2017-02-25 MED ORDER — SIMETHICONE 80 MG PO CHEW: 80.0000 mg | CHEWABLE_TABLET | ORAL | Status: DC | PRN

## 2017-02-25 MED ORDER — COCONUT OIL OIL: 1.0000 "application " | TOPICAL_OIL | Status: DC | PRN

## 2017-02-25 MED ORDER — SODIUM CHLORIDE 0.9% FLUSH: 3.0000 mL | INTRAVENOUS | Status: DC | PRN

## 2017-02-25 MED ORDER — ACETAMINOPHEN 325 MG PO TABS: 650.0000 mg | ORAL_TABLET | ORAL | Status: DC | PRN

## 2017-02-25 MED ORDER — OXYTOCIN 40 UNITS IN LACTATED RINGERS INFUSION - SIMPLE MED: 2.5000 [IU]/h | INTRAVENOUS | Status: DC | PRN

## 2017-02-25 MED ORDER — TETANUS-DIPHTH-ACELL PERTUSSIS 5-2.5-18.5 LF-MCG/0.5 IM SUSP: 0.5000 mL | Freq: Once | INTRAMUSCULAR | Status: DC

## 2017-02-25 MED ORDER — ONDANSETRON HCL 4 MG/2ML IJ SOLN: 4.0000 mg | INTRAMUSCULAR | Status: DC | PRN

## 2017-02-25 MED ORDER — METHYLERGONOVINE MALEATE 0.2 MG/ML IJ SOLN
INTRAMUSCULAR | Status: AC
  Filled 2017-02-25: qty 3

## 2017-02-25 MED ORDER — DIPHENHYDRAMINE HCL 50 MG/ML IJ SOLN
25.0000 mg | Freq: Once | INTRAMUSCULAR | Status: AC
  Administered 2017-02-25: 25 mg via INTRAVENOUS
  Filled 2017-02-25: qty 1

## 2017-02-25 MED ORDER — MISOPROSTOL 200 MCG PO TABS: 50.0000 ug | ORAL_TABLET | ORAL | Status: DC

## 2017-02-25 MED ORDER — PRENATAL MULTIVITAMIN CH: 1.0000 | ORAL_TABLET | Freq: Every day | ORAL | Status: DC

## 2017-02-25 MED ORDER — ONDANSETRON HCL 4 MG PO TABS: 4.0000 mg | ORAL_TABLET | ORAL | Status: DC | PRN

## 2017-02-25 MED ORDER — MISOPROSTOL 50MCG HALF TABLET: 50.0000 ug | ORAL_TABLET | Freq: Four times a day (QID) | ORAL | Status: DC

## 2017-02-25 MED ORDER — OXYTOCIN 40 UNITS IN LACTATED RINGERS INFUSION - SIMPLE MED: 1.0000 m[IU]/min | INTRAVENOUS | Status: DC

## 2017-02-25 MED ORDER — ACETAMINOPHEN 500 MG PO TABS
1000.0000 mg | ORAL_TABLET | Freq: Once | ORAL | Status: AC
  Administered 2017-02-25: 500 mg via ORAL
  Filled 2017-02-25: qty 2

## 2017-02-25 MED ORDER — OXYCODONE-ACETAMINOPHEN 5-325 MG PO TABS: 1.0000 | ORAL_TABLET | ORAL | Status: DC | PRN

## 2017-02-25 MED ORDER — BENZOCAINE-MENTHOL 20-0.5 % EX AERO: 1.0000 "application " | INHALATION_SPRAY | CUTANEOUS | Status: DC | PRN

## 2017-02-25 MED ORDER — LACTATED RINGERS IV BOLUS (SEPSIS)
500.0000 mL | Freq: Once | INTRAVENOUS | Status: DC
Start: 2017-02-25 — End: 2017-02-26

## 2017-02-25 NOTE — Progress Notes (Signed)
Patient ID: Sheila Cline, female   DOB: 08/18/1981, 36 y.o.   MRN: [id number]  Pt getting ready to have epidural placed; had Fentanyl followed by Stadol/Phen and is still having low abd cramping. No vag bldg. Had 50mcg oral cytotec @ 2200.  BP 117/70, other VSS Cx deferred (was very posterior earlier)  IUFD@26wks  Cx unfavorable  Plan to control discomfort w/ epidural Will continue cytotec dosing and use foley bulb/Pit prn  Cam HaiSHAW, Cahterine Heinzel CNM 02/25/2017 12:37 AM

## 2017-02-25 NOTE — Progress Notes (Signed)
Sheila Cline is a 36 y.o. G2P1001 at 176w4d with IUFD. Subjective: Pt comfortable with epidural.  Not feeling pain or contractions.  Support is at bedside.   Objective: BP 117/73   Pulse 84   Temp (!) 101 F (38.3 C) (Axillary)   Resp 16   Ht 5' (1.524 m)   Wt 212 lb (96.2 kg)   LMP 08/14/2016 (Exact Date)   SpO2 96%   BMI 41.40 kg/m  I/O last 3 completed shifts: In: -  Out: 100 [Urine:100] Total I/O In: -  Out: 125 [Urine:125]   UC:   Every 2-3 (external monitor) SVE:   Dilation: 4.5 Effacement (%): 100 Exam by:: Susie Nix RN  Labs: Lab Results  Component Value Date   WBC 22.7 (H) 02/25/2017   HGB 10.3 (L) 02/25/2017   HCT 29.5 (L) 02/25/2017   MCV 89.9 02/25/2017   PLT 91 (L) 02/25/2017    Assessment / Plan: Augmentation of labor, progressing well  1.  Labor--US showed no uterine rupture.  No blood in paracolic gutters.  Fetus is vertex position.  Foley bulb now out and pitocin is being increased 1 milliunit every 30 minutes.  No signs of uterine rupture. 2.  Hematology:  Last fibrinogen is 159 (up from 119).  Hgb 10.3 and another CBC is pending.  1 unit of fibrinogen has been infused and 2nd unit is being hung.  Platelets and blood are available in house.  Vital signs are stable. 3.  Renal:  Creatinine increased, urine output is about 30-40 cc/hour.  Will give fluid boluses as needed and watch electrolytes and creatinine. 4.  ID:  Fever to 101.9.  Zosyn started.  No signs of sepsis.    [name] 02/25/2017, 12:48 PM

## 2017-02-25 NOTE — Progress Notes (Signed)
Notified Philipp DeputyKim Shaw, CNM of patient's low urine output (only 100 mL after 2L of LR), urge to urinate, and full bladder felt on palpation. Will continue to monitor patient closely.

## 2017-02-25 NOTE — Anesthesia Procedure Notes (Signed)
Epidural Patient location during procedure: OB  Staffing Anesthesiologist: Phillips Cline, Sheila Lusby Performed: anesthesiologist   Preanesthetic Checklist Completed: patient identified, site marked, surgical consent, pre-op evaluation, timeout performed, IV checked, risks and benefits discussed and monitors and equipment checked  Epidural Patient position: sitting Prep: DuraPrep Patient monitoring: heart rate, continuous pulse ox and blood pressure Approach: midline Location: L3-L4 Injection technique: LOR saline  Needle:  Needle type: Tuohy  Needle gauge: 17 G Needle length: 9 cm and 9 Needle insertion depth: 7 cm Catheter type: closed end flexible Catheter size: 20 Guage Catheter at skin depth: 11 cm Test dose: negative  Assessment Events: blood not aspirated, injection not painful, no injection resistance, negative IV test and no paresthesia  Additional Notes Patient identified. Risks/Benefits/Options discussed with patient including but not limited to bleeding, infection, nerve damage, paralysis, failed block, incomplete pain control, headache, blood pressure changes, nausea, vomiting, reactions to medication both or allergic, itching and postpartum back pain. Confirmed with bedside nurse the patient's most recent platelet count. Confirmed with patient that they are not currently taking any anticoagulation, have any bleeding history or any family history of bleeding disorders. Patient expressed understanding and wished to proceed. All questions were answered. Sterile technique was used throughout the entire procedure. Please see nursing notes for vital signs. Test dose was given through epidural needle and negative prior to continuing to dose epidural or start infusion. Warning signs of high block given to the patient including shortness of breath, tingling/numbness in hands, complete motor block, or any concerning symptoms with instructions to call for help. Patient was given instructions  on fall risk and not to get out of bed. All questions and concerns addressed with instructions to call with any issues.

## 2017-02-25 NOTE — Anesthesia Preprocedure Evaluation (Signed)
Anesthesia Evaluation  Patient identified by MRN, date of birth, ID band Patient awake    Reviewed: Allergy & Precautions, H&P , NPO status , Patient's Chart, lab work & pertinent test results  History of Anesthesia Complications Negative for: history of anesthetic complications  Airway Mallampati: II  TM Distance: >3 FB Neck ROM: full    Dental no notable dental hx. (+) Teeth Intact   Pulmonary neg pulmonary ROS,    Pulmonary exam normal breath sounds clear to auscultation       Cardiovascular negative cardio ROS Normal cardiovascular exam Rhythm:regular Rate:Normal     Neuro/Psych negative neurological ROS  negative psych ROS   GI/Hepatic negative GI ROS, Neg liver ROS,   Endo/Other  negative endocrine ROSMorbid obesity  Renal/GU negative Renal ROS  negative genitourinary   Musculoskeletal   Abdominal   Peds  Hematology negative hematology ROS (+)   Anesthesia Other Findings   Reproductive/Obstetrics (+) Pregnancy                             Anesthesia Physical Anesthesia Plan  ASA: II  Anesthesia Plan: Epidural   Post-op Pain Management:    Induction:   Airway Management Planned:   Additional Equipment:   Intra-op Plan:   Post-operative Plan:   Informed Consent: I have reviewed the patients History and Physical, chart, labs and discussed the procedure including the risks, benefits and alternatives for the proposed anesthesia with the patient or authorized representative who has indicated his/her understanding and acceptance.     Plan Discussed with:   Anesthesia Plan Comments:         Anesthesia Quick Evaluation  

## 2017-02-25 NOTE — Progress Notes (Signed)
Pharmacy Antibiotic Note  Sheila Cline is a 36 y.o. female admitted on 02/24/2017 with chorio with fetal demise.  Pharmacy has been consulted for Zosyn dosing.  Plan: Zosyn 3.375g IV q8h infused over 4 hours   Height: 5' (152.4 cm) Weight: 212 lb (96.2 kg) IBW/kg (Calculated) : 45.5  Temp (24hrs), Avg:99.8 F (37.7 C), Min:98.2 F (36.8 C), Max:101.9 F (38.8 C)   Recent Labs Lab 02/24/17 2029 02/25/17 0824  WBC 27.0* 22.7*  CREATININE 1.06* 1.32*    Estimated Creatinine Clearance: 61.8 mL/min (A) (by C-G formula based on SCr of 1.32 mg/dL (H)).    No Known Allergies  Antimicrobials this admission: Zosyn 3/24 >>    Dose adjustments this admission: None  Microbiology results: none  Thank you for allowing pharmacy to be a part of this patient's care.  Lenore MannerHolcombe, Ivan Lacher [place] 02/25/2017 11:50 AM

## 2017-02-25 NOTE — Progress Notes (Signed)
Zachary Georgemanda B Gallo is a 36 y.o. G2P1001 at 2666w4d admitted for induction of labor due to IUFD.  Subjective: Comfortable with epidural.  Tocometer on, not picking up many contractions.   Objective: BP 94/67   Pulse (!) 103   Temp 98.2 F (36.8 C) (Oral)   Resp 18   Ht 5' (1.524 m)   Wt 212 lb (96.2 kg)   LMP 08/14/2016 (Exact Date)   SpO2 95%   BMI 41.40 kg/m  No intake/output data recorded. Total I/O In: -  Out: 100 [Urine:100]  SVE:   Dilation: Closed Effacement (%): Thick Exam by:: Vear ClockPhillips MD  Labs: Lab Results  Component Value Date   WBC 27.0 (H) 02/24/2017   HGB 11.9 (L) 02/24/2017   HCT 34.1 (L) 02/24/2017   MCV 88.3 02/24/2017   PLT 120 (L) 02/24/2017   PLT 120 (L) 02/24/2017    Assessment / Plan: Induction of labor due to fetal demise, still closed Continue cytotec. Will place foley bulb when able  Gwenevere AbbotNimeka Toniann Dickerson 02/25/2017, 6:23 AM

## 2017-02-25 NOTE — Progress Notes (Signed)
Sheila Cline is a 36 y.o. G2P1001 at 5139w4d with IUFD and DIC Subjective: Pt comfortable, no pressure.    Objective: BP 108/66   Pulse 78   Temp 99 F (37.2 C) (Axillary)   Resp 18   Ht 5' (1.524 m)   Wt 212 lb (96.2 kg)   LMP 08/14/2016 (Exact Date)   SpO2 97%   BMI 41.40 kg/m  I/O last 3 completed shifts: In: 1214.6 [I.V.:1214.6] Out: 100 [Urine:100] Total I/O In: 1208 [I.V.:668.8; Blood:539.3] Out: 150 [Urine:150]  7/100/-2 AFOM--bloody Vertex well appled  Labs: Lab Results  Component Value Date   WBC 23.4 (H) 02/25/2017   HGB 9.2 (L) 02/25/2017   HCT 26.6 (L) 02/25/2017   MCV 90.5 02/25/2017   PLT 88 (L) 02/25/2017   PLT 87 (L) 02/25/2017    Assessment / Plan: 1-Labor--IUPC placed to titrate pitocin.  No evidence of uterine rupture. 2-Heme--Fibrinogen increasing after first bag of FFP.  Hgb dropped to 9.2,  Plt dropped to 88.  LAbs q 2 hours 3-Renal--UOP 30 cc past 1/2 hour. Will continue to hydrate to keep UOP .5cc/kg/hr.  Anticipate NSVD in the next few hours.  Pt overall stable.    Elsie LincolnKelly Lisvet Rasheed 02/25/2017, 3:45 PM

## 2017-02-25 NOTE — Progress Notes (Signed)
Chaplain responded to consult.  Pt's nurse in room with pt asks pt if chaplain visit is desired.  Pt shares that they are adequately supported at this time and appreciates that chaplain services are available if needed.  Please page chaplain if pt's or family's spiritual care needs change.  Erroll LunaOvercash, Korinne Greenstein A, Chaplain 02/25/17 2:59 PM

## 2017-02-26 DIAGNOSIS — O4590 Premature separation of placenta, unspecified, unspecified trimester: Secondary | ICD-10-CM

## 2017-02-26 LAB — COMPREHENSIVE METABOLIC PANEL
ALBUMIN: 2.2 g/dL — AB (ref 3.5–5.0)
ALK PHOS: 112 U/L (ref 38–126)
ALK PHOS: 100 U/L (ref 38–126)
ALT: 22 U/L (ref 14–54)
ALT: 23 U/L (ref 14–54)
AST: 19 U/L (ref 15–41)
AST: 21 U/L (ref 15–41)
Albumin: 2.2 g/dL — ABNORMAL LOW (ref 3.5–5.0)
Anion gap: 12 (ref 5–15)
Anion gap: 6 (ref 5–15)
BILIRUBIN TOTAL: 0.2 mg/dL — AB (ref 0.3–1.2)
BILIRUBIN TOTAL: 0.4 mg/dL (ref 0.3–1.2)
BUN: 23 mg/dL — AB (ref 6–20)
BUN: 20 mg/dL (ref 6–20)
CALCIUM: 7.7 mg/dL — AB (ref 8.9–10.3)
CALCIUM: 7.8 mg/dL — AB (ref 8.9–10.3)
CO2: 21 mmol/L — ABNORMAL LOW (ref 22–32)
CO2: 23 mmol/L (ref 22–32)
CREATININE: 1.17 mg/dL — AB (ref 0.44–1.00)
Chloride: 106 mmol/L (ref 101–111)
Chloride: 107 mmol/L (ref 101–111)
Creatinine, Ser: 1.05 mg/dL — ABNORMAL HIGH (ref 0.44–1.00)
GFR calc Af Amer: >60 mL/min (ref >60)
GFR calc Af Amer: >60 mL/min (ref >60)
GFR calc non Af Amer: 60 mL/min — ABNORMAL LOW (ref >60)
GFR calc non Af Amer: >60 mL/min (ref >60)
GLUCOSE: 131 mg/dL — AB (ref 65–99)
Glucose, Bld: 94 mg/dL (ref 65–99)
POTASSIUM: 3.3 mmol/L — AB (ref 3.5–5.1)
POTASSIUM: 3.8 mmol/L (ref 3.5–5.1)
Sodium: 136 mmol/L (ref 135–145)
Sodium: 139 mmol/L (ref 135–145)
TOTAL PROTEIN: 4.6 g/dL — AB (ref 6.5–8.1)
TOTAL PROTEIN: 4.7 g/dL — AB (ref 6.5–8.1)

## 2017-02-26 LAB — LUPUS ANTICOAGULANT PANEL
DRVVT: 38.5 s (ref 0.0–47.0)
PTT LA: 39.3 s (ref 0.0–51.9)

## 2017-02-26 LAB — CBC
HCT: 26.1 % — ABNORMAL LOW (ref 36.0–46.0)
HEMATOCRIT: 25.6 % — AB (ref 36.0–46.0)
HEMOGLOBIN: 8.9 g/dL — AB (ref 12.0–15.0)
Hemoglobin: 9.1 g/dL — ABNORMAL LOW (ref 12.0–15.0)
MCH: 31.1 pg (ref 26.0–34.0)
MCH: 31.4 pg (ref 26.0–34.0)
MCHC: 34.8 g/dL (ref 30.0–36.0)
MCHC: 34.9 g/dL (ref 30.0–36.0)
MCV: 89.5 fL (ref 78.0–100.0)
MCV: 90 fL (ref 78.0–100.0)
Platelets: 81 10*3/uL — ABNORMAL LOW (ref 150–400)
Platelets: 90 10*3/uL — ABNORMAL LOW (ref 150–400)
RBC: 2.9 MIL/uL — ABNORMAL LOW (ref 3.87–5.11)
RBC: 2.86 MIL/uL — AB (ref 3.87–5.11)
RDW: 13.9 % (ref 11.5–15.5)
RDW: 14 % (ref 11.5–15.5)
WBC: 22.5 10*3/uL — ABNORMAL HIGH (ref 4.0–10.5)
WBC: 25.3 10*3/uL — AB (ref 4.0–10.5)

## 2017-02-26 LAB — PREPARE FRESH FROZEN PLASMA
UNIT DIVISION: 0
Unit division: 0

## 2017-02-26 LAB — BPAM FFP
BLOOD PRODUCT EXPIRATION DATE: 201803292359
Blood Product Expiration Date: 201803292359
ISSUE DATE / TIME: 201803241033
ISSUE DATE / TIME: 201803241248
Unit Type and Rh: 6200
Unit Type and Rh: 6200

## 2017-02-26 NOTE — Anesthesia Postprocedure Evaluation (Signed)
Anesthesia Post Note  Patient: Zachary Georgemanda B Lasser  Procedure(s) Performed: * No procedures listed *  Patient location during evaluation: Mother Baby Anesthesia Type: Epidural Level of consciousness: awake and alert Pain management: pain level controlled Vital Signs Assessment: post-procedure vital signs reviewed and stable Respiratory status: spontaneous breathing, nonlabored ventilation and respiratory function stable Cardiovascular status: stable Postop Assessment: no headache, no backache and epidural receding Anesthetic complications: no Comments: Platelets 90k this AM. Epidural removed, tip intact. Site is clean and dry. Sensation intact bilat LE. Motor intact. Patient instructed to call immediately with any sensory changes or motor weakness  to LE over next 48hrs.        Last Vitals:  Vitals:   02/25/17 2250 02/26/17 0254  BP: (!) 119/59 106/70  Pulse: 98 79  Resp: 18 16  Temp: 37.1 C 37 C    Last Pain:  Vitals:   02/26/17 0557  TempSrc:   PainSc: 0-No pain   Pain Goal: Patients Stated Pain Goal: 3 (02/26/17 0557)               Phillips Groutarignan, Deontre Allsup

## 2017-02-26 NOTE — Addendum Note (Signed)
Addendum  created 02/26/17 0815 by Phillips GroutPeter Alvin Diffee, MD   Order list changed, Order sets accessed

## 2017-02-26 NOTE — Discharge Instructions (Signed)
Placental Abruption This picture may be helpful in explaining what happened. Placental abruption is a condition in which the placenta partly or completely separates from the uterus before the baby is born. The placenta is the organ that nourishes the unborn baby (fetus). The baby gets his or her blood supply and nutrients through the placenta. It is the babys life support system. The placenta is attached to the inside of the uterus until after the baby is born. Placental abruption is rare, but it can happen any time after 20 weeks of pregnancy. A small separation may not cause problems, but a large separation may be dangerous for you and your baby. A large separation is usually an emergency. It requires treatment right away. What are the causes? In most cases, the cause of this condition is not known. What increases the risk? This condition is more likely to develop in women who:  Have experienced a recent trauma such as a fall, an abdominal injury, or a car accident.  Have a previous placental abruption.  Have high blood pressure (hypertension).  Smoke cigarettes, use alcohol, or use illegal drugs such as cocaine.  Have blood clotting problems.  Experience preterm premature rupture of membranes (PPROM).  Have multiples (twins, triplets, or more).  Have had children before.  Are 36 years of age or older. What are the signs or symptoms? Symptoms of this condition can vary from mild to severe. A small placental abruption may not cause symptoms, or it may cause mild symptoms, which may include:  Mild abdominal pain or lower back pain.  Slight vaginal bleeding. A severe placental abruption will cause symptoms. The symptoms will depend on the size of the separation and the stage of pregnancy. They may include:  Abdominal pain or lower back pain.  Vaginal bleeding.  Tender and hard uterus.  Severe abdominal pain with tenderness.  Continual contractions of your  uterus.  Weakness and light-headedness. How is this diagnosed? This condition may be diagnosed based on:  Your symptoms.  A physical exam.  Ultrasound.  Blood work. This will be done to make sure that there are enough healthy red blood cells and that there are no clotting problems or signs of too much blood loss. How is this treated? Treatment for placental abruption depends on the severity of the condition. For mild cases, treatment may involve monitoring your condition and managing your symptoms. This may involve:  Bed rest and close observation. For more severe cases, emergency treatment is needed. This may involve:  Staying in the hospital until you and your baby are stabilized.  Cesarean delivery of your baby.  A blood transfusion or other fluids given through an IV tube.  Other treatments, depending on:  The amount of bleeding you have.  Whether you or your baby are in distress.  The stage of your pregnancy.  The maturity of the baby. Follow these instructions at home:  Take over-the-counter and prescription medicines only as told by your health care provider. Do not take any medicines that your health care provider has not approved.  Arrange for help at home before and after you deliver your baby, especially if you had a cesarean delivery or if you lost a lot of blood.  Get plenty of rest and sleep.  You do not feel the baby move, or the baby moves very little. This information is not intended to replace advice given to you by your health care provider. Make sure you discuss any questions you have with your  health care provider. Document Released: 11/21/2005 Document Revised: 07/21/2016 Document Reviewed: 06/12/2016 Elsevier Interactive Patient Education  2017 [employer]

## 2017-02-26 NOTE — Discharge Summary (Signed)
Physician Discharge Summary  Patient ID: Sheila Cline MRN: [id number] DOB/AGE: 08-20-1981 35 y.o.  Admit date: 02/24/2017 Discharge date: 02/26/2017  Admission Diagnoses:IUFD, placental abruption.  Discharge Diagnoses:  Active Problems:   IUFD at 20 weeks or more of gestation   Placenta abruption, delivered, current hospitalization   Discharged Condition: good  Hospital Course: Sheila Georgemanda B Bostrom is a 36 y.o. female presenting for Lower abdominal pain and loose stools.  Also had some vomiting.  Also reports no fetal movement today. . RN Note: Patients states this morning when she got up at 5am having lower abdominal pain that is constant and loose stool. Also started vomiting "bile" this am. Tried soaking in the tub for the pain. States unable to hold tylenol, food or drink down. At 530 tried tylenol again and was able to hold down. Reports no fetal movement today. u/s confirmed FDIU and abruption.  Consults:   Significant Diagnostic Studies: labs:  CBC Latest Ref Rng & Units 02/26/2017 02/26/2017 02/25/2017  WBC 4.0 - 10.5 K/uL 22.5(H) 25.3(H) -  Hemoglobin 12.0 - 15.0 g/dL 0.8(M9.1(L) 8.9(L) -  Hematocrit 36.0 - 46.0 % 26.1(L) 25.6(L) -  Platelets 150 - 400 K/uL 90(L) 81(L) 82(L)   CMP Latest Ref Rng & Units 02/26/2017 02/26/2017 02/25/2017  Glucose 65 - 99 mg/dL 94 578(I131(H) 696(E126(H)  BUN 6 - 20 mg/dL 20 95(M23(H) 84(X23(H)  Creatinine 0.44 - 1.00 mg/dL 3.24(M1.05(H) 0.10(U1.17(H) 7.25(D1.29(H)  Sodium 135 - 145 mmol/L 139 136 136  Potassium 3.5 - 5.1 mmol/L 3.8 3.3(L) 3.3(L)  Chloride 101 - 111 mmol/L 106 107 107  CO2 22 - 32 mmol/L 21(L) 23 22  Calcium 8.9 - 10.3 mg/dL 7.8(L) 7.7(L) 7.7(L)  Total Protein 6.5 - 8.1 g/dL 4.7(L) 4.6(L) 4.8(L)  Total Bilirubin 0.3 - 1.2 mg/dL 0.4 6.6(Y0.2(L) 0.5  Alkaline Phos 38 - 126 U/L 100 112 113  AST 15 - 41 U/L 19 21 24   ALT 14 - 54 U/L 22 23 24     Treatments: Foley bulb , pitocin, fluid hydration, 1 unit FFP,                      Spontaneous vaginal delivery  Discharge  Exam: Blood pressure 113/73, pulse 88, temperature 98.3 F (36.8 C), temperature source Oral, resp. rate 16, height 5' (1.524 m), weight 96.2 kg (212 lb), last menstrual period 08/14/2016, SpO2 98 %. General appearance: alert, cooperative, appears stated age and no distress Head: Normocephalic, without obvious abnormality, atraumatic GI: soft, non-tender; bowel sounds normal; no masses,  no organomegaly Extremities: extremities normal, atraumatic, no cyanosis or edema  Disposition:   Discharge Instructions    Call MD for:    Complete by:  As directed    Call for any bleeding recurrence. [facility] We should be able to review any remaining lab tests at that time.   Call MD for:  persistant nausea and vomiting    Complete by:  As directed    Call MD for:  temperature >100.4    Complete by:  As directed    Diet - low sodium heart healthy    Complete by:  As directed    Increase activity slowly    Complete by:  As directed      Allergies as of 02/26/2017   No Known Allergies     Medication List    TAKE these medications   acetaminophen 500 MG tablet Commonly known as:  TYLENOL Take 500  mg by mouth every 6 (six) hours as needed for mild pain, moderate pain, fever or headache.   PRENATAL GUMMIES/DHA & FA 0.4-32.5 MG Chew Chew 2 each by mouth daily.      Follow-up Information    Tilda Burrow, MD. Schedule an appointment as soon as possible for a visit in 1 week(s).   Specialties:  Obstetrics and Gynecology, Radiology Contact information: [address] [place] [address] [phone]           Signed: Tilda Burrow 02/26/2017, 8:57 AM

## 2017-02-28 ENCOUNTER — Other Ambulatory Visit (HOSPITAL_COMMUNITY)
Admission: RE | Admit: 2017-02-28 | Discharge: 2017-02-28 | Disposition: A | Discharge status: Home / Self Care | Payer: Medicaid Other | Source: Ambulatory Visit | Attending: Cardiology | Admitting: Cardiology

## 2017-02-28 ENCOUNTER — Telehealth: Payer: Self-pay | Admitting: Obstetrics and Gynecology

## 2017-02-28 ENCOUNTER — Ambulatory Visit (INDEPENDENT_AMBULATORY_CARE_PROVIDER_SITE_OTHER): Payer: Medicaid Other | Admitting: Obstetrics & Gynecology

## 2017-02-28 ENCOUNTER — Encounter: Payer: Self-pay | Admitting: Obstetrics & Gynecology

## 2017-02-28 VITALS — BP 126/86 | HR 87 | Wt 215.0 lb

## 2017-02-28 DIAGNOSIS — O45002 Premature separation of placenta with coagulation defect, unspecified, second trimester: Secondary | ICD-10-CM

## 2017-02-28 DIAGNOSIS — O4590 Premature separation of placenta, unspecified, unspecified trimester: Secondary | ICD-10-CM | POA: Diagnosis present

## 2017-02-28 LAB — TYPE AND SCREEN
ABO/RH(D): A POS
Antibody Screen: NEGATIVE
UNIT DIVISION: 0
UNIT DIVISION: 0
Unit division: 0
Unit division: 0

## 2017-02-28 LAB — CBC
HEMATOCRIT: 26 % — AB (ref 36.0–46.0)
Hemoglobin: 8.8 g/dL — ABNORMAL LOW (ref 12.0–15.0)
MCH: 30.9 pg (ref 26.0–34.0)
MCHC: 33.8 g/dL (ref 30.0–36.0)
MCV: 91.2 fL (ref 78.0–100.0)
Platelets: 144 10*3/uL — ABNORMAL LOW (ref 150–400)
RBC: 2.85 MIL/uL — ABNORMAL LOW (ref 3.87–5.11)
RDW: 13.7 % (ref 11.5–15.5)
WBC: 12.6 10*3/uL — ABNORMAL HIGH (ref 4.0–10.5)

## 2017-02-28 LAB — COMPREHENSIVE METABOLIC PANEL
ALBUMIN: 2.7 g/dL — AB (ref 3.5–5.0)
ALK PHOS: 119 U/L (ref 38–126)
ALT: 20 U/L (ref 14–54)
AST: 18 U/L (ref 15–41)
Anion gap: 8 (ref 5–15)
BILIRUBIN TOTAL: 0.5 mg/dL (ref 0.3–1.2)
BUN: 13 mg/dL (ref 6–20)
CALCIUM: 8.3 mg/dL — AB (ref 8.9–10.3)
CO2: 26 mmol/L (ref 22–32)
CREATININE: 0.75 mg/dL (ref 0.44–1.00)
Chloride: 107 mmol/L (ref 101–111)
GFR calc Af Amer: >60 mL/min (ref >60)
GFR calc non Af Amer: >60 mL/min (ref >60)
Glucose, Bld: 93 mg/dL (ref 65–99)
Potassium: 3.5 mmol/L (ref 3.5–5.1)
SODIUM: 141 mmol/L (ref 135–145)
Total Protein: 5.6 g/dL — ABNORMAL LOW (ref 6.5–8.1)

## 2017-02-28 LAB — BPAM RBC
BLOOD PRODUCT EXPIRATION DATE: 201803312359
BLOOD PRODUCT EXPIRATION DATE: 201804222359
Blood Product Expiration Date: 201803312359
Blood Product Expiration Date: 201804142359
ISSUE DATE / TIME: 201803230509
ISSUE DATE / TIME: 201803241214
UNIT TYPE AND RH: 6200
UNIT TYPE AND RH: 6200
UNIT TYPE AND RH: 6200
Unit Type and Rh: 6200

## 2017-02-28 MED ORDER — IBUPROFEN 800 MG PO TABS
800.0000 mg | ORAL_TABLET | Freq: Three times a day (TID) | ORAL | 1 refills | Status: DC | PRN
Start: 2017-02-28 — End: 2017-09-25

## 2017-02-28 MED ORDER — ZOLPIDEM TARTRATE 10 MG PO TABS: 10.0000 mg | ORAL_TABLET | Freq: Every evening | ORAL | 0 refills | Status: DC | PRN

## 2017-02-28 MED ORDER — ZOLPIDEM TARTRATE 10 MG PO TABS: 10.0000 mg | ORAL_TABLET | Freq: Every evening | ORAL | 3 refills | Status: DC | PRN

## 2017-02-28 NOTE — Progress Notes (Signed)
Chief Complaint  Patient presents with  . Abdominal Pain    wheezing    Blood pressure 126/86, pulse 87, weight 215 lb (97.5 kg), last menstrual period 08/14/2016.  36 y.o. G2P1001 Patient's last menstrual period was 08/14/2016 (exact date). The current method of family planning is none.  Outpatient Encounter Prescriptions as of 02/28/2017  Medication Sig  . acetaminophen (TYLENOL) 500 MG tablet Take 500 mg by mouth every 6 (six) hours as needed for mild pain, moderate pain, fever or headache.  . ibuprofen (ADVIL,MOTRIN) 800 MG tablet Take 1 tablet (800 mg total) by mouth every 8 (eight) hours as needed.  . Prenatal MV-Min-FA-Omega-3 (PRENATAL GUMMIES/DHA & FA) 0.4-32.5 MG CHEW Chew 2 each by mouth daily.  Marland Kitchen zolpidem (AMBIEN) 10 MG tablet Take 1 tablet (10 mg total) by mouth at bedtime as needed for sleep.   No facility-administered encounter medications on file as of 02/28/2017.     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 She delivered on 324 and was discharged in the morning of 325 Her hemoglobin at the time of discharge was 8.8 with hematocrit of 26,000 and her platelets had come up significantly 144,000 and her fibrinogen was stabilized She comes in this morning complaining of abdominal pain normal bleeding by him and a frontal headache and little shortness of breath Her pulse ox is 99% on room air She's not have any visual complaints She does have a little bit of change with orthostatic position with her headache which may be related to the epidural and/or caffeine withdrawal  Objective She is a bit pale and obviously emotional and upset She is appropriate, answers all my questions On exam her abdomen  is soft and nontender the uterus is normally involuted Bimanual confirms this no adnexal masses no heavy bleeding and the uterus is involuting nicely Pertinent ROS Per history of present illness   Labs or studies Results for orders placed or performed during the hospital encounter of 02/28/17 (from the past 24 hour(s))  CBC   Collection Time: 02/28/17  8:13 AM  Result Value Ref Range   WBC 12.6 (H) 4.0 - 10.5 K/uL   RBC 2.85 (L) 3.87 - 5.11 MIL/uL   Hemoglobin 8.8 (L) 12.0 - 15.0 g/dL   HCT 16.1 (L) 09.6 - 04.5 %   MCV 91.2 78.0 - 100.0 fL   MCH 30.9 26.0 - 34.0 pg   MCHC 33.8 30.0 - 36.0 g/dL   RDW 40.9 81.1 - 91.4 %   Platelets 144 (L) 150 - 400 K/uL  Comprehensive metabolic panel   Collection Time: 02/28/17  8:13 AM  Result Value Ref Range   Sodium 141 135 - 145 mmol/L   Potassium 3.5 3.5 - 5.1 mmol/L   Chloride 107 101 - 111 mmol/L   CO2 26 22 - 32 mmol/L   Glucose, Bld 93 65 - 99 mg/dL   BUN 13 6 - 20 mg/dL   Creatinine, Ser 7.82 0.44 - 1.00 mg/dL   Calcium 8.3 (L) 8.9 - 10.3 mg/dL   Total Protein 5.6 (L) 6.5 - 8.1 g/dL   Albumin 2.7 (L) 3.5 - 5.0 g/dL   AST 18 15 - 41 U/L   ALT 20 14 - 54 U/L  Alkaline Phosphatase 119 38 - 126 U/L   Total Bilirubin 0.5 0.3 - 1.2 mg/dL   GFR calc non Af Amer >60 >60 mL/min   GFR calc Af Amer >60 >60 mL/min   Anion gap 8 5 - 15       Impression Diagnoses this Encounter::   ICD-9-CM ICD-10-CM   1. Placental abruption with coagulation defect in second trimester 641.23 O45.002    641.33     resulted in a fetal demise, [redacted] weeks gestation    Established relevant diagnosis(es): Labs this morning were reviewed and are listed above  Plan/Recommendations: Meds ordered this encounter  Medications  . zolpidem (AMBIEN) 10 MG tablet    Sig: Take 1 tablet (10 mg total) by mouth at bedtime as needed for sleep.    Dispense:  30 tablet    Refill:  3  . ibuprofen (ADVIL,MOTRIN) 800 MG tablet    Sig: Take 1 tablet (800 mg total) by  mouth every 8 (eight) hours as needed.    Dispense:  60 tablet    Refill:  1    Labs or Scans Ordered: No orders of the defined types were placed in this encounter.   Management:: I think Laticha's symptoms are mostly related to her anemia should report improve in the coming days I'm instructed her to increase her fluids and maybe take a little extra caffeine and a cases is related to her epidural as well we did talk about using antidepressants in the short-term and she declines that at this point I do want to keep a close eye entering in to see her next week until her to call me if she has any problems in the meantime  Follow up Return in about 7 days (around 03/07/2017) for 915 AM , with Dr Despina HiddenEure, Follow up.     All questions were answered.  Past Medical History:  Diagnosis Date  . Abnormal pap   . Chlamydia   . Dysplasia of cervix, low grade (CIN 1)   . HPV (human papilloma virus) infection   . HSV-2 (herpes simplex virus 2) infection   . Interstitial cystitis   . OAB (overactive bladder) 09/24/2013  . Stress at home 09/24/2013    Past Surgical History:  Procedure Laterality Date  . CESAREAN SECTION    . tubes in ears      OB History    Gravida Para Term Preterm AB Living   2 1 1     1    SAB TAB Ectopic Multiple Live Births           1      No Known Allergies  Social History   Social History  . Marital status: Married    Spouse name: N/A  . Number of children: N/A  . Years of education: N/A   Social History Main Topics  . Smoking status: Never Smoker  . Smokeless tobacco: Never Used  . Alcohol use No     Comment: occ wine; not now  . Drug use: No  . Sexual activity: Not Currently    Birth control/ protection: None   Other Topics Concern  . None   Social History Narrative  . None    Family History  Problem Relation Age of Onset  . Interstitial cystitis Maternal Grandmother   . Stroke Maternal Grandmother   . Heart disease Maternal Grandmother    . Cancer Maternal Aunt     breast  . Hypertension Mother   . Asthma Mother   .  Asthma Brother   . Asthma Daughter   . Hypertension Maternal Grandfather

## 2017-02-28 NOTE — Telephone Encounter (Signed)
Pt's mother reports pt very fatigued and somewhat short of breath thru the night.  I spoke with Jochebed by phone, voice is unlabored, but will have pt obtain cbc, and [facility] Lab notified and orders placed.

## 2017-03-01 ENCOUNTER — Ambulatory Visit: Payer: Medicaid Other | Admitting: Obstetrics and Gynecology

## 2017-03-01 LAB — CARDIOLIPIN ANTIBODIES, IGM+IGG: Anticardiolipin IgG: <9 GPL U/mL (ref 0–14)

## 2017-03-06 ENCOUNTER — Encounter: Payer: Self-pay | Admitting: Obstetrics & Gynecology

## 2017-03-07 ENCOUNTER — Ambulatory Visit: Payer: Medicaid Other | Admitting: Obstetrics & Gynecology

## 2017-03-07 ENCOUNTER — Encounter: Payer: Self-pay | Admitting: Obstetrics & Gynecology

## 2017-03-07 VITALS — BP 132/88 | HR 93 | Wt 201.0 lb

## 2017-03-07 DIAGNOSIS — Z8759 Personal history of other complications of pregnancy, childbirth and the puerperium: Secondary | ICD-10-CM

## 2017-03-10 ENCOUNTER — Other Ambulatory Visit: Payer: Medicaid Other

## 2017-03-10 ENCOUNTER — Encounter: Payer: Medicaid Other | Admitting: Women's Health

## 2017-03-12 NOTE — Progress Notes (Signed)
Chief Complaint  Patient presents with  . Follow-up    Blood pressure 132/88, pulse 93, weight 201 lb (91.2 kg), last menstrual period 08/14/2016, unknown if currently breastfeeding.  36 y.o. G2P1001 Patient's last menstrual period was 08/14/2016 (exact date). The current method of family planning is none.  Outpatient Encounter Prescriptions as of 03/07/2017  Medication Sig  . ibuprofen (ADVIL,MOTRIN) 800 MG tablet Take 1 tablet (800 mg total) by mouth every 8 (eight) hours as needed.  . Prenatal MV-Min-FA-Omega-3 (PRENATAL GUMMIES/DHA & FA) 0.4-32.5 MG CHEW Chew 2 each by mouth daily.  Marland Kitchen acetaminophen (TYLENOL) 500 MG tablet Take 500 mg by mouth every 6 (six) hours as needed for mild pain, moderate pain, fever or headache.  . zolpidem (AMBIEN) 10 MG tablet Take 1 tablet (10 mg total) by mouth at bedtime as needed for sleep. (Patient not taking: Reported on 03/07/2017)   No facility-administered encounter medications on file as of 03/07/2017.     Subjective Follow up from last weeks visit'm seeing Delita today in follow-up as a work in Unfortunately on 02/24/2017 she experienced a large placental abruption and had a fetal loss at 26 weeks and 3 days gestation This was confirmed on ultrasound and she experienced a consumptive coagulopathy due to disseminated intravascular coagulation She delivered on 324 and was discharged in the morning of 325 Her hemoglobin at the time of discharge was 8.8 with hematocrit of 26,000 and her platelets had come up significantly 144,000 and her fibrinogen was stabilized She comes in this morning complaining of abdominal pain normal bleeding by him and a frontal headache and little shortness of breath Her pulse ox is 99% on room air She's not have any visual complaints She does have a little bit of change with orthostatic position with her headache which may be related to the epidural and/or caffeine withdrawal   I am seeing her back to make sure she  is doing ok emotionally and physically She states she is sleeping ok and while certainly devastated, she is coping with the help of family and friends, continues to not want to use SSRI in the short term   Objective   Pertinent ROS Not suicidal thoughts or ideations  Labs or studies     Impression Diagnoses this Encounter::   ICD-9-CM ICD-10-CM   1. H/O fetal loss V13.29 Z87.59    placental abrution, 3/24    Established relevant diagnosis(es):   Plan/Recommendations: No orders of the defined types were placed in this encounter.   Labs or Scans Ordered: No orders of the defined types were placed in this encounter.   Management:: Pt seems to be coping appropriately, physically improved, tires easily no energy from anemia and circumstances  Follow up Return in about 2 weeks (around 03/21/2017) for Follow up, with Dr Despina Hidden.        Face to face time:  15 minutes  Greater than 50% of the visit time was spent in counseling and coordination of care with the patient.  The summary and outline of the counseling and care coordination is summarized in the note above.   All questions were answered.  Past Medical History:  Diagnosis Date  . Abnormal pap   . Chlamydia   . Dysplasia of cervix, low grade (CIN 1)   . HPV (human papilloma virus) infection   . HSV-2 (herpes simplex virus 2) infection   . Interstitial cystitis   . OAB (overactive bladder) 09/24/2013  . Stress at home 09/24/2013  Past Surgical History:  Procedure Laterality Date  . CESAREAN SECTION    . tubes in ears      OB History as of 03/07/17    Gravida Para Term Preterm AB Living   SAB TAB Ectopic Multiple Live Births           1      No Known Allergies  Social History   Social History  . Marital status: Married    Spouse name: N/A  . Number of children: N/A  . Years of education: N/A   Social History Main Topics  . Smoking status: Never Smoker  . Smokeless  tobacco: Never Used  . Alcohol use No     Comment: occ wine; not now  . Drug use: No  . Sexual activity: Not Currently    Birth control/ protection: None   Other Topics Concern  . None   Social History Narrative  . None    Family History  Problem Relation Age of Onset  . Interstitial cystitis Maternal Grandmother   . Stroke Maternal Grandmother   . Heart disease Maternal Grandmother   . Cancer Maternal Aunt     breast  . Hypertension Mother   . Asthma Mother   . Asthma Brother   . Asthma Daughter   . Hypertension Maternal Grandfather

## 2017-03-14 ENCOUNTER — Institutional Professional Consult (permissible substitution): Payer: Medicaid Other | Admitting: Pediatrics

## 2017-04-17 ENCOUNTER — Encounter: Payer: Self-pay | Admitting: Obstetrics & Gynecology

## 2017-04-18 ENCOUNTER — Encounter: Payer: Self-pay | Admitting: Obstetrics & Gynecology

## 2017-04-18 ENCOUNTER — Ambulatory Visit (INDEPENDENT_AMBULATORY_CARE_PROVIDER_SITE_OTHER): Payer: Medicaid Other | Admitting: Obstetrics & Gynecology

## 2017-04-18 VITALS — BP 116/78 | HR 76 | Wt 205.0 lb

## 2017-04-18 DIAGNOSIS — Z8759 Personal history of other complications of pregnancy, childbirth and the puerperium: Secondary | ICD-10-CM | POA: Diagnosis not present

## 2017-04-18 DIAGNOSIS — O45002 Premature separation of placenta with coagulation defect, unspecified, second trimester: Secondary | ICD-10-CM

## 2017-04-18 NOTE — Progress Notes (Signed)
Chief Complaint  Patient presents with  . Follow-up    Blood pressure 116/78, pulse 76, weight 205 lb (93 kg), last menstrual period 04/04/2017, not currently breastfeeding.  36 y.o. G2P1001 Patient's last menstrual period was 04/04/2017 (exact date). The current method of family planning is none.  Outpatient Encounter Prescriptions as of 04/18/2017  Medication Sig  . acetaminophen (TYLENOL) 500 MG tablet Take 500 mg by mouth every 6 (six) hours as needed for mild pain, moderate pain, fever or headache.  . cetirizine (ZYRTEC) 10 MG tablet Take 10 mg by mouth daily.  . fluticasone (FLONASE) 50 MCG/ACT nasal spray Place 1 spray into both nostrils 2 (two) times daily.  Marland Kitchen. ibuprofen (ADVIL,MOTRIN) 800 MG tablet Take 1 tablet (800 mg total) by mouth every 8 (eight) hours as needed.  . Prenatal MV-Min-FA-Omega-3 (PRENATAL GUMMIES/DHA & FA) 0.4-32.5 MG CHEW Chew 2 each by mouth daily.  Marland Kitchen. zolpidem (AMBIEN) 10 MG tablet Take 1 tablet (10 mg total) by mouth at bedtime as needed for sleep. (Patient not taking: Reported on 03/07/2017)   No facility-administered encounter medications on file as of 04/18/2017.     Subjective Sheila Cline and I  basically we just reviewed the issues related to her pathology report answer questions regarding abruption in general, management and  timing of future pregnancies Pathology report was of course normal We spent about 20 minutes discussing when I thought the best time for her to get pregnant was She of course is 35 and had concerns about that I recommended she waits at least 6 months ideally one year but I would be supportive of a 6 month gap between trying to get pregnant   Objective   Pertinent ROS No burning with urination, frequency or urgency No nausea, vomiting or diarrhea Nor fever chills or other constitutional symptoms   Labs or studies All previous pathology reports and laboratory data is reviewed with the patient    Impression Diagnoses  this Encounter::   ICD-10-CM   1. H/O fetal loss Z87.59   2. Placental abruption with coagulation defect in second trimester O45.002     Established relevant diagnosis(es):   Plan/Recommendations: Meds ordered this encounter  Medications  . cetirizine (ZYRTEC) 10 MG tablet    Sig: Take 10 mg by mouth daily.  . fluticasone (FLONASE) 50 MCG/ACT nasal spray    Sig: Place 1 spray into both nostrils 2 (two) times daily.    Labs or Scans Ordered: No orders of the defined types were placed in this encounter.   Management:: As stated above  Follow up Return if symptoms worsen or fail to improve, for Follow up, with Dr Despina HiddenEure.        Face to face time:  15 minutes  Greater than 50% of the visit time was spent in counseling and coordination of care with the patient.  The summary and outline of the counseling and care coordination is summarized in the note above.   All questions were answered.  Past Medical History:  Diagnosis Date  . Abnormal pap   . Chlamydia   . Dysplasia of cervix, low grade (CIN 1)   . HPV (human papilloma virus) infection   . HSV-2 (herpes simplex virus 2) infection   . Interstitial cystitis   . OAB (overactive bladder) 09/24/2013  . Stress at home 09/24/2013    Past Surgical History:  Procedure Laterality Date  . CESAREAN SECTION    . tubes in ears  OB History    Gravida Para Term Preterm AB Living   2 1 1     1    SAB TAB Ectopic Multiple Live Births           1      No Known Allergies  Social History   Social History  . Marital status: Married    Spouse name: N/A  . Number of children: N/A  . Years of education: N/A   Social History Main Topics  . Smoking status: Never Smoker  . Smokeless tobacco: Never Used  . Alcohol use No     Comment: occ wine; not now  . Drug use: No  . Sexual activity: Yes    Birth control/ protection: None   Other Topics Concern  . None   Social History Narrative  . None    Family  History  Problem Relation Age of Onset  . Interstitial cystitis Maternal Grandmother   . Stroke Maternal Grandmother   . Heart disease Maternal Grandmother   . Cancer Maternal Aunt        breast  . Hypertension Mother   . Asthma Mother   . Asthma Brother   . Asthma Daughter   . Hypertension Maternal Grandfather

## 2017-08-28 ENCOUNTER — Ambulatory Visit (INDEPENDENT_AMBULATORY_CARE_PROVIDER_SITE_OTHER): Payer: Managed Care, Other (non HMO) | Admitting: General Practice

## 2017-08-28 ENCOUNTER — Encounter: Payer: Self-pay | Admitting: General Practice

## 2017-08-28 DIAGNOSIS — Z3201 Encounter for pregnancy test, result positive: Secondary | ICD-10-CM

## 2017-08-28 DIAGNOSIS — O0991 Supervision of high risk pregnancy, unspecified, first trimester: Secondary | ICD-10-CM

## 2017-08-28 LAB — POCT PREGNANCY, URINE: PREG TEST UR: POSITIVE — AB

## 2017-08-28 NOTE — Progress Notes (Signed)
Patient here for upt today. UPT +. Patient reports first positive home test 08/25/17. LMP 07/26/17 EDD 05/02/18 [redacted]w[redacted]d. Patient denies medication, only taking PNV & extra folic acid. Patient reports history of IUFD at 6 months earlier this year. Patient requests to come here. Discussed with patient possible plans for this pregnancy given history and encouraged new OB appt at end of October-early November. Patient requests appt with Dr Penne Lash if possible. Pregnancy verification letter given. Enrolled patient in [employer]. Patient had no questions

## 2017-09-04 DIAGNOSIS — I82812 Embolism and thrombosis of superficial veins of left lower extremities: Secondary | ICD-10-CM

## 2017-09-04 HISTORY — DX: Embolism and thrombosis of superficial veins of left lower extremity: I82.812

## 2017-09-25 ENCOUNTER — Encounter: Payer: Self-pay | Admitting: Obstetrics & Gynecology

## 2017-09-25 ENCOUNTER — Ambulatory Visit: Payer: Self-pay

## 2017-09-25 ENCOUNTER — Ambulatory Visit (INDEPENDENT_AMBULATORY_CARE_PROVIDER_SITE_OTHER): Payer: Managed Care, Other (non HMO) | Admitting: Obstetrics & Gynecology

## 2017-09-25 VITALS — BP 110/83 | HR 86 | Wt 210.0 lb

## 2017-09-25 DIAGNOSIS — Z124 Encounter for screening for malignant neoplasm of cervix: Secondary | ICD-10-CM | POA: Diagnosis not present

## 2017-09-25 DIAGNOSIS — Z8759 Personal history of other complications of pregnancy, childbirth and the puerperium: Secondary | ICD-10-CM

## 2017-09-25 DIAGNOSIS — N898 Other specified noninflammatory disorders of vagina: Secondary | ICD-10-CM

## 2017-09-25 DIAGNOSIS — E669 Obesity, unspecified: Secondary | ICD-10-CM

## 2017-09-25 DIAGNOSIS — Z113 Encounter for screening for infections with a predominantly sexual mode of transmission: Secondary | ICD-10-CM | POA: Diagnosis not present

## 2017-09-25 DIAGNOSIS — O3680X0 Pregnancy with inconclusive fetal viability, not applicable or unspecified: Secondary | ICD-10-CM | POA: Diagnosis not present

## 2017-09-25 DIAGNOSIS — M79605 Pain in left leg: Secondary | ICD-10-CM

## 2017-09-25 DIAGNOSIS — O0991 Supervision of high risk pregnancy, unspecified, first trimester: Secondary | ICD-10-CM

## 2017-09-25 DIAGNOSIS — O0990 Supervision of high risk pregnancy, unspecified, unspecified trimester: Secondary | ICD-10-CM | POA: Insufficient documentation

## 2017-09-25 DIAGNOSIS — M7989 Other specified soft tissue disorders: Secondary | ICD-10-CM

## 2017-09-25 DIAGNOSIS — R87610 Atypical squamous cells of undetermined significance on cytologic smear of cervix (ASC-US): Secondary | ICD-10-CM

## 2017-09-25 DIAGNOSIS — O99211 Obesity complicating pregnancy, first trimester: Secondary | ICD-10-CM

## 2017-09-25 DIAGNOSIS — Z1151 Encounter for screening for human papillomavirus (HPV): Secondary | ICD-10-CM

## 2017-09-25 DIAGNOSIS — O99210 Obesity complicating pregnancy, unspecified trimester: Secondary | ICD-10-CM

## 2017-09-25 HISTORY — DX: Personal history of other complications of pregnancy, childbirth and the puerperium: Z87.59

## 2017-09-25 LAB — POCT URINALYSIS DIP (DEVICE)
BILIRUBIN URINE: NEGATIVE
Glucose, UA: NEGATIVE mg/dL
Hgb urine dipstick: NEGATIVE
Ketones, ur: 40 mg/dL — AB
LEUKOCYTES UA: NEGATIVE
NITRITE: NEGATIVE
PH: 6 (ref 5.0–8.0)
Protein, ur: NEGATIVE mg/dL
Specific Gravity, Urine: 1.015 (ref 1.005–1.030)
UROBILINOGEN UA: 0.2 mg/dL (ref 0.0–1.0)

## 2017-09-25 NOTE — Progress Notes (Deleted)
ini

## 2017-09-25 NOTE — Progress Notes (Signed)
Subjective:    Sheila Cline is a [id number] [redacted]w[redacted]d being seen today for her first obstetrical visit.  Her obstetrical history is significant for advanced maternal age, obesity and his placental abruption and IUFD. Patient does intend to breast feed. Pregnancy history fully reviewed.  Patient reports no complaints.  Vitals:   09/25/17 1319  BP: 110/83  Pulse: 86  Weight: 210 lb (95.3 kg)    HISTORY: OB History  Gravida Para Term Preterm AB Living  3 2 1 1   1   SAB TAB Ectopic Multiple Live Births          1    # Outcome Date GA Lbr Len/2nd Weight Sex Delivery Anes PTL Lv  3 Current           2 Preterm 02/24/17 [redacted]w[redacted]d    Vag-Spont   FD     Birth Comments: placenta abruption  1 Term 10/07/04 [redacted]w[redacted]d  8 lb 14 oz (4.026 kg) F CS-LTranv Spinal N LIV     Birth Comments: Emergency c/s FTP     Past Medical History:  Diagnosis Date  . Abnormal pap   . Chlamydia   . Dysplasia of cervix, low grade (CIN 1)   . HPV (human papilloma virus) infection   . HSV-2 (herpes simplex virus 2) infection   . Interstitial cystitis   . OAB (overactive bladder) 09/24/2013  . Stress at home 09/24/2013  . Vaginal Pap smear, abnormal    Past Surgical History:  Procedure Laterality Date  . CESAREAN SECTION    . tubes in ears     Family History  Problem Relation Age of Onset  . Interstitial cystitis Maternal Grandmother   . Stroke Maternal Grandmother   . Heart disease Maternal Grandmother   . Cancer Maternal Aunt        breast  . Hypertension Mother   . Asthma Mother   . Asthma Brother   . Asthma Daughter   . Hypertension Maternal Grandfather      Exam    Uterus:     Pelvic Exam:    Perineum: No Hemorrhoids   Vulva: normal   Vagina:  normal mucosa, wet prep done   pH: n/a   Cervix: no cervical motion tenderness and no lesions   Adnexa: no mass, fullness, tenderness   Bony Pelvis: average  System: Breast:  large bruise on left breast after plucking hair from aereola.  No other  bruising.  Pt denies domestic abuse.   Skin: normal coloration and turgor, no rashes    Neurologic: oriented, normal mood   Extremities: Mass and redness behind left petella; varicose veins   HEENT extra ocular movement intact, sclera clear, anicteric, oropharynx clear, no lesions, neck supple with midline trachea, thyroid without masses and trachea midline   Mouth/Teeth mucous membranes moist, pharynx normal without lesions and dental hygiene good   Neck supple and no masses   Cardiovascular: regular rate and rhythm   Respiratory:  appears well, vitals normal, no respiratory distress, acyanotic, normal RR, chest clear, no wheezing, crepitations, rhonchi, normal symmetric air entry   Abdomen: soft, non-tender; bowel sounds normal; no masses,  no organomegaly   Urinary: urethral meatus normal      Assessment:    Pregnancy: [id number] Patient Active Problem List   Diagnosis Date Noted  . Supervision of high risk pregnancy, antepartum 09/25/2017  . History of placenta abruption 09/25/2017  . Pregnancy complicated by obesity 09/25/2017  . IUFD at 20 weeks or more of  gestation 02/24/2017  . Stress at home 09/24/2013  . OAB (overactive bladder) 09/24/2013  . Chlamydia 03/25/2013  . ASCUS of cervix with negative high risk HPV 03/25/2013  . HSV-2 (herpes simplex virus 2) infection 03/25/2013  . Chronic interstitial cystitis 02/25/2013        Plan:     Initial labs drawn. Prenatal vitamins. Problem list reviewed and updated. Genetic Screening discussed Panorama ordered fro 12 weeks.  Ultrasound discussed; fetal survey: bedside US shows viable IUP with FH 1.  Placental abruption with IUFD--complete APLA; antenatal testing per protocol, TSH added 2.  Obesity in pregnancy--early glucola, nutrition referral 3.  AMA--Panorama, daily asa at 13-36 weeks 4.  Babyscripts app only 5.  Anatomy US 6.  History of abnmormal pap--rpt pap today  7.  Vaginal discharge--wet prep/ routine  testing 8.  Left calf and petellar fossa pain/mass--LE dopplers   Follow up in 4 weeks.   Elsie LincolnKelly Eulogia Dismore 09/25/2017

## 2017-09-25 NOTE — Progress Notes (Signed)
Here for initial prenatal visit. Given new patient education booklets. C/o feeling wet a lot in her panties. Already has bab[employer]cripps app. Self pay- is gettting her medicaid recertified.

## 2017-09-25 NOTE — Progress Notes (Signed)
Pt informed that the ultrasound is considered a limited OB ultrasound and is not intended to be a complete ultrasound exam.  Patient also informed that the ultrasound is not being completed with the intent of assessing for fetal or placental anomalies or any pelvic abnormalities.  Explained that the purpose of today's ultrasound is to assess for viability.  Patient acknowledges the purpose of the exam and the limitations of the study.    Single IUP FHR - 173 bpm per M-mode CRL -  2.13 cm (8w 4d)

## 2017-09-26 ENCOUNTER — Other Ambulatory Visit: Payer: Self-pay | Admitting: Family Medicine

## 2017-09-26 ENCOUNTER — Other Ambulatory Visit: Payer: Self-pay | Admitting: *Deleted

## 2017-09-26 ENCOUNTER — Ambulatory Visit (HOSPITAL_COMMUNITY)
Admission: RE | Admit: 2017-09-26 | Discharge: 2017-09-26 | Disposition: A | Discharge status: Home / Self Care | Payer: Managed Care, Other (non HMO) | Source: Ambulatory Visit | Attending: Obstetrics & Gynecology | Admitting: Obstetrics & Gynecology

## 2017-09-26 ENCOUNTER — Telehealth: Payer: Self-pay | Admitting: *Deleted

## 2017-09-26 DIAGNOSIS — I82812 Embolism and thrombosis of superficial veins of left lower extremities: Secondary | ICD-10-CM | POA: Diagnosis not present

## 2017-09-26 DIAGNOSIS — M79605 Pain in left leg: Secondary | ICD-10-CM | POA: Insufficient documentation

## 2017-09-26 DIAGNOSIS — M7989 Other specified soft tissue disorders: Secondary | ICD-10-CM | POA: Insufficient documentation

## 2017-09-26 MED ORDER — ENOXAPARIN SODIUM 40 MG/0.4ML ~~LOC~~ SOLN: 40.0000 mg | SUBCUTANEOUS | 0 refills | Status: DC

## 2017-09-26 MED ORDER — ENOXAPARIN SODIUM 40 MG/0.4ML ~~LOC~~ SOLN: 40.0000 mg | Freq: Two times a day (BID) | SUBCUTANEOUS | 0 refills | Status: DC

## 2017-09-26 NOTE — Progress Notes (Unsigned)
Per Dr.Stinson lovenox should be for 45 days bid not daily. Order changed

## 2017-09-26 NOTE — Care Management Note (Signed)
Case Management Note  Patient Details  Name: Sheila Cline MRN: [id number] Date of Birth: 01-20-1981  Subjective/Objective:          Doppler studies 09/26/17 that show negative dvt but does have superficial thrombus in varicose veins.           Should start Lovenox bid for 45 days because of pregnancy she has increased risk of pulmonary embolus.   Action/Plan: Medication Assistance - MATCH Program Lovenox 78m BID x 45 days                Expected Discharge Plan:   [facility]  In-House Referral:  NA  Discharge planning Services  CM Consult, Medication Assistance  Post Acute Care Choice:  NA Choice offered to:  NA  DME Arranged:  N/A DME Agency:  NA  HH Arranged:  NA HH Agency:  NA  Status of Service:  Completed, signed off  Additional Comments: 10/23- Case Manager notified by Clinic Nurse that Pt will need medication assistance until her Medicaid is approved.  The re-certification application will not be turned in until Friday 09/29/17 - waiting on husband's pay stub.  Per the Nurse - the Pt's GCorcoranMother is a retired NMarine scientistand can show the Pt how to do the injections or do them for her.  Pt is anxious about giving herself the injections.  Nurse feels that it would be in the Pt's best interest to have a Nurse visit in the Clinic for education and instruction on giving herself the injections. The Pt will only need 45 days of Lovenox 440mBID per the Physician.  [facility] [place] spoke [place]line in the Co[place]garding the cost override and number of days override.  Per NiLaverta Baltimorehe will put this into the system and Pt should use a [facility] CM [facility] will need 90 syringes and they do not have that amount in stock but could have it all tomorrow.  Pt can pick up the amount that [facility] has prior to  her Clinic appt - so that she will have it for the appt - and then pick the rest of it up after her clinic appt. [facility] - [facility]..  CM spoke with Pt about the above - [facility] [place] after she drops her child off at school. Pt voiced understanding that she will be given instruction on the Lovenox in the am by the Clinic Nurse - she will need to bring her Lovenox with her to the appt. CM obtained a Lovenox starter kit [facility] gave [facility] to the [facility] Nurse.  10[facility]24- [facility] use.  See Clinic Note.  Sheila BeamaOntarioRNMerom0/23/2018, 3:33 PM

## 2017-09-26 NOTE — Telephone Encounter (Signed)
[facility] results of doppler studies that show negative dvt but does have superficial thrombus in varicose veins. . I asked them to have her wait while I reviewed results with provider.  I discussed results with Dr. Adrian BlackwaterStinson who said he needed to review her chart and would call me back. Dr. Adrian BlackwaterStinson called back and reccommendation is she should start lovenox bid for 45 days  because of pregnancy she has increased risk of pulmonary embolus. He will put in order.  I called and spoke with Sheila Folksmanda and she voices understanding but she does not have insurance now but is getting her medicaid recertified - has to turn in husbands pay check this Friday and not sure how soon until she will hear if she is approved.  Per discussion with Dr. Adrian BlackwaterStinson wants her to start lovenox as soon as possible so will refer to Care management to see if they can help patient.  I called Sheila Cline, Care Manager and she will initiate the process.  I called patient to let her know we have involved care management so we can get her started on lovenox this week as soon as possible. She is agreeable to plan and will be able to pick up from outpatient pharmacy.  I explained either care manager or one of us will call her back and she can make appt for nurse visit to teach her how to do lovenox injections.

## 2017-09-26 NOTE — Progress Notes (Signed)
Called by office. Patient has superficial thrombus in varicose vein in left lower leg. Recommend intermediate dose of lovenox (40mg  BID) x45 days due to complication of pregnancy.  Levie HeritageJacob J Lyda Colcord, DO 09/26/2017 10:13 AM

## 2017-09-26 NOTE — Progress Notes (Signed)
*  PRELIMINARY RESULTS* Vascular Ultrasound Left lower ext venous duplex has been completed.  Preliminary findings: No evidence of DVT or baker's cyst. Superficial thrombosis noted in varicose veins of the left proximal calf/ knee area.  [facility] at Dr. Bertram DenverLeggett's office.   Farrel DemarkJill Eunice, RDMS, RVT  09/26/2017, 9:26 AM

## 2017-09-27 ENCOUNTER — Ambulatory Visit (INDEPENDENT_AMBULATORY_CARE_PROVIDER_SITE_OTHER): Payer: Self-pay | Admitting: General Practice

## 2017-09-27 DIAGNOSIS — Z7189 Other specified counseling: Secondary | ICD-10-CM

## 2017-09-27 LAB — CYTOLOGY - PAP
Chlamydia: NEGATIVE
HPV (WINDOPATH): NOT DETECTED
Neisseria Gonorrhea: NEGATIVE

## 2017-09-27 LAB — CULTURE, OB URINE

## 2017-09-27 LAB — CERVICOVAGINAL ANCILLARY ONLY
Bacterial vaginitis: NEGATIVE
CANDIDA VAGINITIS: NEGATIVE
TRICH (WINDOWPATH): NEGATIVE

## 2017-09-27 LAB — URINE CULTURE, OB REFLEX

## 2017-09-27 NOTE — Progress Notes (Signed)
Patient here today for lovenox injection teaching. lovenox starter kit given to patient. Instructed patient on importance of hand hygiene with self administered injections, location sites for injections with rotation of sites at each injection, cleansing of skin prior to injection, pinching of skin before & during injection with slow release after injection, do not push out air bubble, injection of needle at 90 degrees with slow administration of medication and then withdrawing of needle. Also instructed patient not to rub injection site and that bruising, soreness, or hard spots in injection site as well as burning are common sensations. Also instructed in proper needle disposal. Patient verbalized understanding to all and self administered first lovenox injection following instructions. Also recommended to patient that she may find thigh high compression hose helpful. Patient asked if walking was still okay and if she should keep her propped. Told patient she certainly may which would be encouraged. Patient verbalized understanding & had no questions

## 2017-09-28 MED FILL — ENOXAPARIN 40 MG/0.4 ML SYR: 40 | 45 days supply | Qty: 36 | Fill #0

## 2017-09-29 LAB — OBSTETRIC PANEL, INCLUDING HIV
Antibody Screen: NEGATIVE
Basophils Absolute: 0 10*3/uL (ref 0.0–0.2)
Basos: 0 %
EOS (ABSOLUTE): 0.1 10*3/uL (ref 0.0–0.4)
EOS: 1 %
HEMATOCRIT: 42.2 % (ref 34.0–46.6)
HEMOGLOBIN: 13.6 g/dL (ref 11.1–15.9)
HEP B S AG: NEGATIVE
HIV Screen 4th Generation wRfx: NONREACTIVE
IMMATURE GRANS (ABS): 0 10*3/uL (ref 0.0–0.1)
IMMATURE GRANULOCYTES: 0 %
LYMPHS ABS: 1.8 10*3/uL (ref 0.7–3.1)
LYMPHS: 15 %
MCH: 28.9 pg (ref 26.6–33.0)
MCHC: 32.2 g/dL (ref 31.5–35.7)
MCV: 90 fL (ref 79–97)
MONOS ABS: 0.6 10*3/uL (ref 0.1–0.9)
Monocytes: 5 %
Neutrophils Absolute: 9.9 10*3/uL — ABNORMAL HIGH (ref 1.4–7.0)
Neutrophils: 79 %
Platelets: 219 10*3/uL (ref 150–379)
RBC: 4.7 x10E6/uL (ref 3.77–5.28)
RDW: 15.6 % — ABNORMAL HIGH (ref 12.3–15.4)
RH TYPE: POSITIVE
RPR Ser Ql: NONREACTIVE
Rubella Antibodies, IGG: 4.77 index (ref >0.99)
WBC: 12.4 10*3/uL — AB (ref 3.4–10.8)

## 2017-09-29 LAB — BETA-2-GLYCOPROTEIN I ABS, IGG/M/A
Beta-2 Glyco 1 IgA: <9 GPI IgA units (ref 0–25)
Beta-2 Glyco 1 IgM: <9 GPI IgM units (ref 0–32)

## 2017-09-29 LAB — CYSTIC FIBROSIS GENE TEST

## 2017-09-29 LAB — TSH: TSH: 0.494 u[IU]/mL (ref 0.450–4.500)

## 2017-09-29 LAB — GLUCOSE TOLERANCE, 1 HOUR: Glucose, 1Hr PP: 164 mg/dL (ref 65–199)

## 2017-10-02 ENCOUNTER — Other Ambulatory Visit: Payer: Self-pay | Admitting: Obstetrics & Gynecology

## 2017-10-02 ENCOUNTER — Encounter: Payer: Self-pay | Admitting: Obstetrics & Gynecology

## 2017-10-02 DIAGNOSIS — Z8759 Personal history of other complications of pregnancy, childbirth and the puerperium: Secondary | ICD-10-CM

## 2017-10-06 ENCOUNTER — Telehealth: Payer: Self-pay

## 2017-10-06 NOTE — Telephone Encounter (Signed)
Pt informed of results and the need for 3 hr GTT.  Pt stated that she will be able to come in on 10/09/17 at 0740.  I informed pt that we will be able to get her in to see provider as well if she gets here at 0740 no later than 0800.  Pt stated that she will get someone else to take her children to school so that she will be able to make the appt.  I advised pt to fast midnight before and that she will be here in facility for the duration of the test.  Pt stated understanding.

## 2017-10-06 NOTE — Telephone Encounter (Signed)
-----   Message from Lesly DukesKelly H Leggett, MD sent at 10/02/2017 11:56 AM EDT ----- LSIL on pap with HPV negative.  Can defer pap until post partum visit.   GCT is elevated and needs 3 hour GTT.

## 2017-10-09 ENCOUNTER — Ambulatory Visit (INDEPENDENT_AMBULATORY_CARE_PROVIDER_SITE_OTHER): Payer: Self-pay | Admitting: Obstetrics and Gynecology

## 2017-10-09 ENCOUNTER — Encounter: Payer: Self-pay | Admitting: Obstetrics and Gynecology

## 2017-10-09 VITALS — BP 113/82 | HR 88 | Wt 210.0 lb

## 2017-10-09 DIAGNOSIS — O0990 Supervision of high risk pregnancy, unspecified, unspecified trimester: Secondary | ICD-10-CM

## 2017-10-09 DIAGNOSIS — R87612 Low grade squamous intraepithelial lesion on cytologic smear of cervix (LGSIL): Secondary | ICD-10-CM | POA: Insufficient documentation

## 2017-10-09 DIAGNOSIS — F439 Reaction to severe stress, unspecified: Secondary | ICD-10-CM

## 2017-10-09 DIAGNOSIS — Z8759 Personal history of other complications of pregnancy, childbirth and the puerperium: Secondary | ICD-10-CM

## 2017-10-09 DIAGNOSIS — I82812 Embolism and thrombosis of superficial veins of left lower extremities: Secondary | ICD-10-CM

## 2017-10-09 DIAGNOSIS — O99210 Obesity complicating pregnancy, unspecified trimester: Secondary | ICD-10-CM

## 2017-10-09 DIAGNOSIS — B009 Herpesviral infection, unspecified: Secondary | ICD-10-CM

## 2017-10-09 NOTE — Patient Instructions (Signed)
Second Trimester of Pregnancy The second trimester is from week 13 through week 28, month 4 through 6. This is often the time in pregnancy that you feel your best. Often times, morning sickness has lessened or quit. You may have more energy, and you may get hungry more often. Your unborn baby (fetus) is growing rapidly. At the end of the sixth month, he or she is about 9 inches long and weighs about 1 pounds. You will likely feel the baby move (quickening) between 18 and 20 weeks of pregnancy. Follow these instructions at home:  Avoid all smoking, herbs, and alcohol. Avoid drugs not approved by your doctor.  Do not use any tobacco products, including cigarettes, chewing tobacco, and electronic cigarettes. If you need help quitting, ask your doctor. You may get counseling or other support to help you quit.  Only take medicine as told by your doctor. Some medicines are safe and some are not during pregnancy.  Exercise only as told by your doctor. Stop exercising if you start having cramps.  Eat regular, healthy meals.  Wear a good support bra if your breasts are tender.  Do not use hot tubs, steam rooms, or saunas.  Wear your seat belt when driving.  Avoid raw meat, uncooked cheese, and liter boxes and soil used by cats.  Take your prenatal vitamins.  Take 1500-2000 milligrams of calcium daily starting at the 20th week of pregnancy until you deliver your baby.  Try taking medicine that helps you poop (stool softener) as needed, and if your doctor approves. Eat more fiber by eating fresh fruit, vegetables, and whole grains. Drink enough fluids to keep your pee (urine) clear or pale yellow.  Take warm water baths (sitz baths) to soothe pain or discomfort caused by hemorrhoids. Use hemorrhoid cream if your doctor approves.  If you have puffy, bulging veins (varicose veins), wear support hose. Raise (elevate) your feet for 15 minutes, 3-4 times a day. Limit salt in your diet.  Avoid heavy  lifting, wear low heals, and sit up straight.  Rest with your legs raised if you have leg cramps or low back pain.  Visit your dentist if you have not gone during your pregnancy. Use a soft toothbrush to brush your teeth. Be gentle when you floss.  You can have sex (intercourse) unless your doctor tells you not to.  Go to your doctor visits. Get help if:  You feel dizzy.  You have mild cramps or pressure in your lower belly (abdomen).  You have a nagging pain in your belly area.  You continue to feel sick to your stomach (nauseous), throw up (vomit), or have watery poop (diarrhea).  You have bad smelling fluid coming from your vagina.  You have pain with peeing (urination). Get help right away if:  You have a fever.  You are leaking fluid from your vagina.  You have spotting or bleeding from your vagina.  You have severe belly cramping or pain.  You lose or gain weight rapidly.  You have trouble catching your breath and have chest pain.  You notice sudden or extreme puffiness (swelling) of your face, hands, ankles, feet, or legs.  You have not felt the baby move in over an hour.  You have severe headaches that do not go away with medicine.  You have vision changes. This information is not intended to replace advice given to you by your health care provider. Make sure you discuss any questions you have with your health care   provider. Document Released: 02/15/2010 Document Revised: 04/28/2016 Document Reviewed: 01/22/2013 Elsevier Interactive Patient Education  2017 Elsevier Inc.  

## 2017-10-09 NOTE — Progress Notes (Signed)
FHR obtained with bedside US.

## 2017-10-09 NOTE — Progress Notes (Signed)
   PRENATAL VISIT NOTE  Subjective:  Sheila Cline is a 36 y.o. G3P1101 at 5427w5d being seen today for ongoing prenatal care.  She is currently monitored for the following issues for this high-risk pregnancy and has Chronic interstitial cystitis; Chlamydia; ASCUS of cervix with negative high risk HPV; HSV-2 (herpes simplex virus 2) infection; Stress at home; OAB (overactive bladder); IUFD at 20 weeks or more of gestation; Supervision of high risk pregnancy, antepartum; History of placenta abruption; Pregnancy complicated by obesity; LGSIL on Pap smear of cervix; and Superficial thrombosis of left lower extremity on their problem list.  Patient reports occasional nausea, left lower leg pain and swelling improved.  Contractions: Not present. Vag. Bleeding: None.  Movement: Absent. Denies leaking of fluid.   The following portions of the patient's history were reviewed and updated as appropriate: allergies, current medications, past family history, past medical history, past social history, past surgical history and problem list. Problem list updated.  Objective:   Vitals:   10/09/17 0748  BP: 113/82  Pulse: 88  Weight: 210 lb (95.3 kg)    Fetal Status: Fetal Heart Rate (bpm): 168   Movement: Absent     General:  Alert, oriented and cooperative. Patient is in no acute distress.  Skin: Skin is warm and dry. No rash noted.   Cardiovascular: Normal heart rate noted  Respiratory: Normal respiratory effort, no problems with respiration noted  Abdomen: Soft, gravid, appropriate for gestational age.  Pain/Pressure: Absent     Pelvic: Cervical exam deferred        Extremities: Normal range of motion.  Edema: None left lower posterior knee without swelling or erythema or tenderness  Mental Status:  Normal mood and affect. Normal behavior. Normal judgment and thought content.   Assessment and Plan:  Pregnancy: G3P1101 at 6127w5d  1. Supervision of high risk pregnancy, antepartum Offered panorama  and patient accepts, will wait until medicaid kicks in in a couple weeks to do it  2. Stress at home  3. Obesity affecting pregnancy, antepartum  4. History of placenta abruption  5. LGSIL on Pap smear of cervix Pap pp  6. Superficial thrombosis of left lower extremity On lovenox 40 mg BID x 45 days  Starting 10/23  7. HSV-2 (herpes simplex virus 2) infection ppx 34 weeks   Preterm labor symptoms and general obstetric precautions including but not limited to vaginal bleeding, contractions, leaking of fluid and fetal movement were reviewed in detail with the patient. Please refer to After Visit Summary for other counseling recommendations.  Return in about 2 weeks (around 10/23/2017) for OB visit (MD).   Conan BowensKelly M Ashling Roane, MD

## 2017-10-09 NOTE — Addendum Note (Signed)
Addended by: Kathee DeltonHILLMAN, CARRIE L on: 10/09/2017 12:19 PM   Modules accepted: Orders

## 2017-10-10 LAB — GESTATIONAL GLUCOSE TOLERANCE
GLUCOSE 3 HOUR GTT: 80 mg/dL (ref 65–139)
GLUCOSE FASTING: 83 mg/dL (ref 65–94)
Glucose, GTT - 1 Hour: 154 mg/dL (ref 65–179)
Glucose, GTT - 2 Hour: 140 mg/dL (ref 65–154)

## 2017-10-23 ENCOUNTER — Encounter: Payer: Self-pay | Admitting: Obstetrics and Gynecology

## 2017-10-23 ENCOUNTER — Ambulatory Visit (INDEPENDENT_AMBULATORY_CARE_PROVIDER_SITE_OTHER): Payer: Medicaid Other | Admitting: Obstetrics and Gynecology

## 2017-10-23 VITALS — BP 116/82 | HR 105 | Wt 212.4 lb

## 2017-10-23 DIAGNOSIS — I82812 Embolism and thrombosis of superficial veins of left lower extremities: Secondary | ICD-10-CM

## 2017-10-23 DIAGNOSIS — O99210 Obesity complicating pregnancy, unspecified trimester: Secondary | ICD-10-CM

## 2017-10-23 DIAGNOSIS — O09529 Supervision of elderly multigravida, unspecified trimester: Secondary | ICD-10-CM

## 2017-10-23 DIAGNOSIS — O09521 Supervision of elderly multigravida, first trimester: Secondary | ICD-10-CM

## 2017-10-23 DIAGNOSIS — Z23 Encounter for immunization: Secondary | ICD-10-CM | POA: Diagnosis not present

## 2017-10-23 DIAGNOSIS — O0991 Supervision of high risk pregnancy, unspecified, first trimester: Secondary | ICD-10-CM | POA: Diagnosis not present

## 2017-10-23 DIAGNOSIS — O0990 Supervision of high risk pregnancy, unspecified, unspecified trimester: Secondary | ICD-10-CM

## 2017-10-23 DIAGNOSIS — B009 Herpesviral infection, unspecified: Secondary | ICD-10-CM

## 2017-10-23 DIAGNOSIS — R87612 Low grade squamous intraepithelial lesion on cytologic smear of cervix (LGSIL): Secondary | ICD-10-CM

## 2017-10-23 LAB — POCT URINALYSIS DIP (DEVICE)
BILIRUBIN URINE: NEGATIVE
Glucose, UA: NEGATIVE mg/dL
Hgb urine dipstick: NEGATIVE
KETONES UR: NEGATIVE mg/dL
LEUKOCYTES UA: NEGATIVE
NITRITE: NEGATIVE
PH: 6 (ref 5.0–8.0)
Protein, ur: NEGATIVE mg/dL
Specific Gravity, Urine: 1.025 (ref 1.005–1.030)
Urobilinogen, UA: 0.2 mg/dL (ref 0.0–1.0)

## 2017-10-23 NOTE — Patient Instructions (Addendum)
Contraception Choices Contraception (birth control) is the use of any methods or devices to prevent pregnancy. Below are some methods to help avoid pregnancy. Hormonal methods  Contraceptive implant. This is a thin, plastic tube containing progesterone hormone. It does not contain estrogen hormone. Your health care provider inserts the tube in the inner part of the upper arm. The tube can remain in place for up to 3 years. After 3 years, the implant must be removed. The implant prevents the ovaries from releasing an egg (ovulation), thickens the cervical mucus to prevent sperm from entering the uterus, and thins the lining of the inside of the uterus.  Progesterone-only injections. These injections are given every 3 months by your health care provider to prevent pregnancy. This synthetic progesterone hormone stops the ovaries from releasing eggs. It also thickens cervical mucus and changes the uterine lining. This makes it harder for sperm to survive in the uterus.  Birth control pills. These pills contain estrogen and progesterone hormone. They work by preventing the ovaries from releasing eggs (ovulation). They also cause the cervical mucus to thicken, preventing the sperm from entering the uterus. Birth control pills are prescribed by a health care provider.Birth control pills can also be used to treat heavy periods.  Minipill. This type of birth control pill contains only the progesterone hormone. They are taken every day of each month and must be prescribed by your health care provider.  Birth control patch. The patch contains hormones similar to those in birth control pills. It must be changed once a week and is prescribed by a health care provider.  Vaginal ring. The ring contains hormones similar to those in birth control pills. It is left in the vagina for 3 weeks, removed for 1 week, and then a new one is put back in place. The patient must be comfortable inserting and removing the ring from  the vagina.A health care provider's prescription is necessary.  Emergency contraception. Emergency contraceptives prevent pregnancy after unprotected sexual intercourse. This pill can be taken right after sex or up to 5 days after unprotected sex. It is most effective the sooner you take the pills after having sexual intercourse. Most emergency contraceptive pills are available without a prescription. Check with your pharmacist. Do not use emergency contraception as your only form of birth control. Barrier methods  Female condom. This is a thin sheath (latex or rubber) that is worn over the penis during sexual intercourse. It can be used with spermicide to increase effectiveness.  Female condom. This is a soft, loose-fitting sheath that is put into the vagina before sexual intercourse.  Diaphragm. This is a soft, latex, dome-shaped barrier that must be fitted by a health care provider. It is inserted into the vagina, along with a spermicidal jelly. It is inserted before intercourse. The diaphragm should be left in the vagina for 6 to 8 hours after intercourse.  Cervical cap. This is a round, soft, latex or plastic cup that fits over the cervix and must be fitted by a health care provider. The cap can be left in place for up to 48 hours after intercourse.  Sponge. This is a soft, circular piece of polyurethane foam. The sponge has spermicide in it. It is inserted into the vagina after wetting it and before sexual intercourse.  Spermicides. These are chemicals that kill or block sperm from entering the cervix and uterus. They come in the form of creams, jellies, suppositories, foam, or tablets. They do not require a prescription. They   are inserted into the vagina with an applicator before having sexual intercourse. The process must be repeated every time you have sexual intercourse. Intrauterine contraception  Intrauterine device (IUD). This is a T-shaped device that is put in a woman's uterus during  a menstrual period to prevent pregnancy. There are 2 types: ? Copper IUD. This type of IUD is wrapped in copper wire and is placed inside the uterus. Copper makes the uterus and fallopian tubes produce a fluid that kills sperm. It can stay in place for 10 years. ? Hormone IUD. This type of IUD contains the hormone progestin (synthetic progesterone). The hormone thickens the cervical mucus and prevents sperm from entering the uterus, and it also thins the uterine lining to prevent implantation of a fertilized egg. The hormone can weaken or kill the sperm that get into the uterus. It can stay in place for 3-5 years, depending on which type of IUD is used. Permanent methods of contraception  Female tubal ligation. This is when the woman's fallopian tubes are surgically sealed, tied, or blocked to prevent the egg from traveling to the uterus.  Hysteroscopic sterilization. This involves placing a small coil or insert into each fallopian tube. Your doctor uses a technique called hysteroscopy to do the procedure. The device causes scar tissue to form. This results in permanent blockage of the fallopian tubes, so the sperm cannot fertilize the egg. It takes about 3 months after the procedure for the tubes to become blocked. You must use another form of birth control for these 3 months.  Female sterilization. This is when the female has the tubes that carry sperm tied off (vasectomy).This blocks sperm from entering the vagina during sexual intercourse. After the procedure, the man can still ejaculate fluid (semen). Natural planning methods  Natural family planning. This is not having sexual intercourse or using a barrier method (condom, diaphragm, cervical cap) on days the woman could become pregnant.  Calendar method. This is keeping track of the length of each menstrual cycle and identifying when you are fertile.  Ovulation method. This is avoiding sexual intercourse during ovulation.  Symptothermal method.  This is avoiding sexual intercourse during ovulation, using a thermometer and ovulation symptoms.  Post-ovulation method. This is timing sexual intercourse after you have ovulated. Regardless of which type or method of contraception you choose, it is important that you use condoms to protect against the transmission of sexually transmitted infections (STIs). Talk with your health care provider about which form of contraception is most appropriate for you. This information is not intended to replace advice given to you by your health care provider. Make sure you discuss any questions you have with your health care provider. Document Released: 11/21/2005 Document Revised: 04/28/2016 Document Reviewed: 05/16/2013 Elsevier Interactive Patient Education  2017 Elsevier [employer].    AREA PEDIATRIC/FAMILY PRACTICE PHYSICIANS  [facility] [address] [place], [place]  [address] Phone - [phone]   Fax - [phone]  [facility] [address] [place], [place] [address] Phone - [phone]   Fax - [phone]  JACK AMOS [address] [place], [place]  [address] Phone - [phone]   Fax - [phone]  [facility] [address] [place], [place]  [address] Phone - [phone]   Fax - [phone]  [facility] [address] [place], [place]  [address] Phone - [phone]   Fax - [phone]  [facility] [address], [address] [place], [place]  [address] Phone - [phone]   Fax - [phone]  [facility] [address] [place], [place]  [address] Phone - [phone]   Fax - [phone]  [facility] [address], [address] [place], [place]  [address] Phone - [phone]   Fax - [phone]  [facility] [address] [place], [place]  [address] Phone -  [phone]   Fax - [phone] [facility] [address] [place], [place]  [address] Phone - [phone]   Fax - [phone]  [facility] [address] [place], [place]  [address] Phone - [phone]   Fax - [phone]  [facility] [address], [address] [place] [place], [place]  [address] Phone - [phone]   Fax - [phone]  [facility] [address], [address] [place], [place]  [address] Phone - [phone]   Fax - [phone]  [facility] [address], [address] [place], [place]  [address] Phone - [phone]  [facility] [address] [place], [place]  [address] Phone - [phone]   Fax - [phone]  [facility] [address], [address] [place], [place]  [address] Phone - [phone]   Fax - [phone]  [facility] [address] [place], [place]  [address] Phone - [phone]   Fax - [phone]  [facility] [address] [place], [place]  [address] Phone - [phone]   Fax - [phone]   [facility] [address], [address] [place], [place]  [address] Phone - [phone]   Fax - [phone]  [facility] [address], [address] [place], [place]  [address] Phone - [phone]   Fax - [phone]  DAVID RUBIN [address] [place], [place]  [address] Phone - [phone]   Fax - [phone]  [facility] [address] [place], [place]  [address] Phone - [phone]   Fax - [phone]  [facility] [address] , [place]  [address] Phone - [phone]   Fax - [phone] [facility] [place] [address] [place], [place]  [address] Phone - [phone]   Fax - [phone]  [facility] [address] [place], [place]  [address] Phone - [phone]   Fax -  [phone]  [facility] [place] [address] [place], [place]  [address] Phone - [phone]   Fax - [phone]  [facility] Virden Wyvonne Lenzharlene Flemming MD [address] [place] [address] Phone [phone]  Fax [phone]   For prenatal classes go to [email or web address]

## 2017-10-23 NOTE — Progress Notes (Signed)
FHR obtained by PW doppler with bedside US.  FM also noted.

## 2017-10-23 NOTE — Progress Notes (Signed)
   PRENATAL VISIT NOTE  Subjective:  Sheila Cline is a 36 y.o. G3P1101 at 2955w5d being seen today for ongoing prenatal care.  She is currently monitored for the following issues for this high-risk pregnancy and has Chronic interstitial cystitis; Chlamydia; HSV-2 (herpes simplex virus 2) infection; Stress at home; OAB (overactive bladder); Supervision of high risk pregnancy, antepartum; History of placenta abruption; Pregnancy complicated by obesity; LGSIL on Pap smear of cervix; and Superficial thrombosis of left lower extremity on their problem list.  Patient reports no complaints.  Contractions: Not present. Vag. Bleeding: None.  Movement: Absent. Denies leaking of fluid.   The following portions of the patient's history were reviewed and updated as appropriate: allergies, current medications, past family history, past medical history, past social history, past surgical history and problem list. Problem list updated.  Objective:   Vitals:   10/23/17 0850  BP: 116/82  Pulse: (!) 105  Weight: 212 lb 6.4 oz (96.3 kg)    Fetal Status: Fetal Heart Rate (bpm): 154   Movement: Absent     General:  Alert, oriented and cooperative. Patient is in no acute distress.  Skin: Skin is warm and dry. No rash noted.   Cardiovascular: Normal heart rate noted  Respiratory: Normal respiratory effort, no problems with respiration noted  Abdomen: Soft, gravid, appropriate for gestational age.  Pain/Pressure: Absent     Pelvic: Cervical exam deferred        Extremities: Normal range of motion.  Edema: None, left posterior knee warm but afebrile to touch, non-tender, non-erythematous  Mental Status:  Normal mood and affect. Normal behavior. Normal judgment and thought content.   Assessment and Plan:  Pregnancy: G3P1101 at 6055w5d  1. Supervision of high risk pregnancy, antepartum - Flu Vaccine QUAD 36+ mos IM - US MFM OB DETAIL +14 WK; Future - panorama today Negative early GTT, needs 2 hr at 28  weeks  2. Superficial thrombosis of left lower extremity On lovenox BID  3. Antepartum multigravida of advanced maternal age  754. HSV-2 (herpes simplex virus 2) infection ppx 36 weeks  5. LGSIL on Pap smear of cervix PP colpo  6. Obesity affecting pregnancy, antepartum   Preterm labor symptoms and general obstetric precautions including but not limited to vaginal bleeding, contractions, leaking of fluid and fetal movement were reviewed in detail with the patient. Please refer to After Visit Summary for other counseling recommendations.  Return in about 3 weeks (around 11/13/2017) for OB visit (MD).   Conan BowensKelly M Zada Haser, MD

## 2017-10-23 NOTE — Progress Notes (Signed)
Panorama sent pick up #[id number].

## 2017-10-23 NOTE — Progress Notes (Signed)
Would like to get flu shot today and panorama.

## 2017-11-01 ENCOUNTER — Inpatient Hospital Stay (HOSPITAL_COMMUNITY)
Admission: AD | Admit: 2017-11-01 | Discharge: 2017-11-01 | Disposition: A | Discharge status: Home / Self Care | Payer: Medicaid Other | Source: Ambulatory Visit | Attending: Family Medicine | Admitting: Family Medicine

## 2017-11-01 ENCOUNTER — Encounter: Payer: Self-pay | Admitting: Obstetrics and Gynecology

## 2017-11-01 ENCOUNTER — Encounter (HOSPITAL_COMMUNITY): Payer: Self-pay | Admitting: *Deleted

## 2017-11-01 DIAGNOSIS — O9989 Other specified diseases and conditions complicating pregnancy, childbirth and the puerperium: Secondary | ICD-10-CM | POA: Diagnosis not present

## 2017-11-01 DIAGNOSIS — Z3A14 14 weeks gestation of pregnancy: Secondary | ICD-10-CM | POA: Insufficient documentation

## 2017-11-01 DIAGNOSIS — Z823 Family history of stroke: Secondary | ICD-10-CM | POA: Diagnosis not present

## 2017-11-01 DIAGNOSIS — O34219 Maternal care for unspecified type scar from previous cesarean delivery: Secondary | ICD-10-CM | POA: Diagnosis not present

## 2017-11-01 DIAGNOSIS — Z803 Family history of malignant neoplasm of breast: Secondary | ICD-10-CM | POA: Insufficient documentation

## 2017-11-01 DIAGNOSIS — I82812 Embolism and thrombosis of superficial veins of left lower extremities: Secondary | ICD-10-CM

## 2017-11-01 DIAGNOSIS — Z8249 Family history of ischemic heart disease and other diseases of the circulatory system: Secondary | ICD-10-CM | POA: Insufficient documentation

## 2017-11-01 DIAGNOSIS — N949 Unspecified condition associated with female genital organs and menstrual cycle: Secondary | ICD-10-CM | POA: Diagnosis not present

## 2017-11-01 DIAGNOSIS — Z79899 Other long term (current) drug therapy: Secondary | ICD-10-CM | POA: Insufficient documentation

## 2017-11-01 DIAGNOSIS — Z8759 Personal history of other complications of pregnancy, childbirth and the puerperium: Secondary | ICD-10-CM

## 2017-11-01 DIAGNOSIS — O26892 Other specified pregnancy related conditions, second trimester: Secondary | ICD-10-CM | POA: Insufficient documentation

## 2017-11-01 DIAGNOSIS — R87612 Low grade squamous intraepithelial lesion on cytologic smear of cervix (LGSIL): Secondary | ICD-10-CM

## 2017-11-01 DIAGNOSIS — Z825 Family history of asthma and other chronic lower respiratory diseases: Secondary | ICD-10-CM | POA: Insufficient documentation

## 2017-11-01 DIAGNOSIS — O0990 Supervision of high risk pregnancy, unspecified, unspecified trimester: Secondary | ICD-10-CM

## 2017-11-01 DIAGNOSIS — O99210 Obesity complicating pregnancy, unspecified trimester: Secondary | ICD-10-CM

## 2017-11-01 LAB — WET PREP, GENITAL
CLUE CELLS WET PREP: NONE SEEN
Sperm: NONE SEEN
Trich, Wet Prep: NONE SEEN
YEAST WET PREP: NONE SEEN

## 2017-11-01 LAB — URINALYSIS, ROUTINE W REFLEX MICROSCOPIC
Bilirubin Urine: NEGATIVE
GLUCOSE, UA: NEGATIVE mg/dL
Hgb urine dipstick: NEGATIVE
Ketones, ur: 20 mg/dL — AB
LEUKOCYTES UA: NEGATIVE
NITRITE: NEGATIVE
PROTEIN: NEGATIVE mg/dL
Specific Gravity, Urine: 1.021 (ref 1.005–1.030)
pH: 6 (ref 5.0–8.0)

## 2017-11-01 NOTE — MAU Provider Note (Signed)
History     CSN: [id number]  Arrival date and time: 11/01/17 0120   First Provider Initiated Contact with Patient 11/01/17 0239      Chief Complaint  Patient presents with  . Abdominal Pain   HPI  Ms. Sheila Cline is a 36 y.o. G3P1101 at 5042w0d gestation presenting to MAU with complaints of an abdominal cramp on her LT side    Past Medical History:  Diagnosis Date  . Abnormal pap   . Chlamydia   . Dysplasia of cervix, low grade (CIN 1)   . HPV (human papilloma virus) infection   . HSV-2 (herpes simplex virus 2) infection   . Interstitial cystitis   . OAB (overactive bladder) 09/24/2013  . Stress at home 09/24/2013  . Vaginal Pap smear, abnormal     Past Surgical History:  Procedure Laterality Date  . CESAREAN SECTION    . tubes in ears      Family History  Problem Relation Age of Onset  . Interstitial cystitis Maternal Grandmother   . Stroke Maternal Grandmother   . Heart disease Maternal Grandmother   . Cancer Maternal Aunt        breast  . Hypertension Mother   . Asthma Mother   . Asthma Brother   . Asthma Daughter   . Hypertension Maternal Grandfather     Social History   Tobacco Use  . Smoking status: Never Smoker  . Smokeless tobacco: Never Used  Substance Use Topics  . Alcohol use: No    Comment: occ wine; not now  . Drug use: No    Allergies: No Known Allergies  Medications Prior to Admission  Medication Sig Dispense Refill Last Dose  . acetaminophen (TYLENOL) 500 MG tablet Take 500 mg by mouth every 6 (six) hours as needed for mild pain, moderate pain, fever or headache.   Past Week at Unknown time  . enoxaparin (LOVENOX) 40 MG/0.4ML injection Inject 0.4 mLs (40 mg total) into the skin every 12 (twelve) hours. 90 Syringe 0 10/31/2017 at 1930  . Prenatal MV-Min-FA-Omega-3 (PRENATAL GUMMIES/DHA & FA) 0.4-32.5 MG CHEW Chew 2 each by mouth daily.   10/31/2017 at Unknown time    Review of Systems  Constitutional: Negative.   HENT:  Negative.   Eyes: Negative.   Respiratory: Negative.   Cardiovascular: Negative.   Gastrointestinal: Positive for abdominal pain (LT lower abdominal).  Endocrine: Negative.   Genitourinary: Positive for pelvic pain.  Musculoskeletal: Negative.   Skin: Negative.   Allergic/Immunologic: Negative.   Neurological: Negative.   Hematological: Negative.   Psychiatric/Behavioral: Negative.    Physical Exam   Blood pressure 119/82, pulse 92, temperature 98.3 F (36.8 C), temperature source Oral, resp. rate 20, height 5' (1.524 m), weight 214 lb 4 oz (97.2 kg), last menstrual period 07/26/2017.  Physical Exam  Nursing note and vitals reviewed. Constitutional: She appears well-developed and well-nourished.  HENT:  Head: Normocephalic.  Eyes: Pupils are equal, round, and reactive to light.  Neck: Normal range of motion.  Cardiovascular: Normal rate, regular rhythm and normal heart sounds.  Respiratory: Effort normal and breath sounds normal.  GI: Soft. Bowel sounds are normal.  Genitourinary:  Genitourinary Comments: Uterus: gravid, S=D, cx: smooth, pink, no lesions, small amt of thick, white vaginal d/c, closed/long/firm, no CMT or friability, no adnexal tenderness   Musculoskeletal: Normal range of motion.  Neurological: She is alert.  Skin: Skin is warm and dry.  Psychiatric: She has a normal mood and affect. Her behavior  is normal. Judgment and thought content normal.    MAU Course  Procedures  MDM CCUA Wet Prep GC/CT   Results for orders placed or performed during the hospital encounter of 11/01/17 (from the past 24 hour(s))  Urinalysis, Routine w reflex microscopic     Status: Abnormal   Collection Time: 11/01/17  1:42 AM  Result Value Ref Range   Color, Urine YELLOW YELLOW   APPearance HAZY (A) CLEAR   Specific Gravity, Urine 1.021 1.005 - 1.030   pH 6.0 5.0 - 8.0   Glucose, UA NEGATIVE NEGATIVE mg/dL   Hgb urine dipstick NEGATIVE NEGATIVE   Bilirubin Urine NEGATIVE  NEGATIVE   Ketones, ur 20 (A) NEGATIVE mg/dL   Protein, ur NEGATIVE NEGATIVE mg/dL   Nitrite NEGATIVE NEGATIVE   Leukocytes, UA NEGATIVE NEGATIVE  Wet prep, genital     Status: Abnormal   Collection Time: 11/01/17  2:49 AM  Result Value Ref Range   Yeast Wet Prep HPF POC NONE SEEN NONE SEEN   Trich, Wet Prep NONE SEEN NONE SEEN   Clue Cells Wet Prep HPF POC NONE SEEN NONE SEEN   WBC, Wet Prep HPF POC MODERATE (A) NONE SEEN   Sperm NONE SEEN        Assessment and Plan  Round ligament pain - Information provided on RLP,  - Advised to lie on affected side,  - Warm compress for 15 mins at a time or soak in warm tub  - May take Tylenol 1000 mg po every 6 hrs prn pain - Reassurance given that RLP is normal variation of pregnancy - Discharge home - Keep scheduled appt on 12/10 - Patient verbalized an understanding of the plan of care and agrees.   Sheila Moraolitta Kainen Struckman, MSN, CNM 11/01/2017, 2:58 AM

## 2017-11-01 NOTE — MAU Note (Signed)
PT SAYS  SHE HAS A CRAMP IN LEFT SIDE OF  ABD- STARTED AT 10PM- WHILE SITTING IN CHAIR.   NEVER HAPPENED  BEFORE.  PNC  - IN CLINIC.

## 2017-11-02 LAB — GC/CHLAMYDIA PROBE AMP (~~LOC~~) NOT AT ARMC
CHLAMYDIA, DNA PROBE: NEGATIVE
NEISSERIA GONORRHEA: NEGATIVE

## 2017-11-03 ENCOUNTER — Encounter: Payer: Self-pay | Admitting: General Practice

## 2017-11-07 ENCOUNTER — Encounter: Payer: Self-pay | Admitting: *Deleted

## 2017-11-12 ENCOUNTER — Encounter: Payer: Self-pay | Admitting: Obstetrics and Gynecology

## 2017-11-13 ENCOUNTER — Encounter: Payer: Medicaid Other | Admitting: Obstetrics & Gynecology

## 2017-11-15 ENCOUNTER — Other Ambulatory Visit: Payer: Self-pay | Admitting: Obstetrics and Gynecology

## 2017-11-15 ENCOUNTER — Encounter: Payer: Medicaid Other | Admitting: Obstetrics and Gynecology

## 2017-11-15 MED ORDER — ASPIRIN EC 81 MG PO TBEC: 81.0000 mg | DELAYED_RELEASE_TABLET | Freq: Every day | ORAL | 2 refills | Status: DC

## 2017-11-15 NOTE — Progress Notes (Signed)
Script for baby aspirin sent to pharmacy.

## 2017-11-29 ENCOUNTER — Ambulatory Visit (INDEPENDENT_AMBULATORY_CARE_PROVIDER_SITE_OTHER): Payer: Medicaid Other | Admitting: Obstetrics & Gynecology

## 2017-11-29 VITALS — BP 117/77 | HR 96 | Wt 218.8 lb

## 2017-11-29 DIAGNOSIS — O0992 Supervision of high risk pregnancy, unspecified, second trimester: Secondary | ICD-10-CM

## 2017-11-29 DIAGNOSIS — I82812 Embolism and thrombosis of superficial veins of left lower extremities: Secondary | ICD-10-CM

## 2017-11-29 DIAGNOSIS — O0990 Supervision of high risk pregnancy, unspecified, unspecified trimester: Secondary | ICD-10-CM

## 2017-11-29 NOTE — Progress Notes (Signed)
   PRENATAL VISIT NOTE  Subjective:  Sheila Cline is a 36 y.o. G3P1101 at 8559w0d being seen today for ongoing prenatal care.  She is currently monitored for the following issues for this high-risk pregnancy and has Chronic interstitial cystitis; HSV-2 (herpes simplex virus 2) infection; Stress at home; OAB (overactive bladder); Supervision of high risk pregnancy, antepartum; History of placenta abruption; Pregnancy complicated by obesity; LGSIL on Pap smear of cervix; Superficial thrombosis of left lower extremity; and Round ligament pain on their problem list.  Patient reports no complaints.  Contractions: Not present. Vag. Bleeding: None.  Movement: Present. Denies leaking of fluid.   The following portions of the patient's history were reviewed and updated as appropriate: allergies, current medications, past family history, past medical history, past social history, past surgical history and problem list. Problem list updated.  Objective:   Vitals:   11/29/17 0941  BP: 117/77  Pulse: 96  Weight: 218 lb 12.8 oz (99.2 kg)    Fetal Status: Fetal Heart Rate (bpm): 145   Movement: Present     General:  Alert, oriented and cooperative. Patient is in no acute distress.  Skin: Skin is warm and dry. No rash noted.   Cardiovascular: Normal heart rate noted  Respiratory: Normal respiratory effort, no problems with respiration noted  Abdomen: Soft, gravid, appropriate for gestational age.  Pain/Pressure: Absent     Pelvic: Cervical exam deferred        Extremities: Normal range of motion.     Mental Status:  Normal mood and affect. Normal behavior. Normal judgment and thought content.   Assessment and Plan:  Pregnancy: G3P1101 at 7959w0d  1. Superficial thrombosis of left lower extremity Continue ASA 81 po qd.  2. Supervision of high risk pregnancy, antepartum Anatomy scan already scheduled. Low risk female on NIPS.  No other complaints or concerns.  Routine obstetric precautions  reviewed. Please refer to After Visit Summary for other counseling recommendations.  Return in about 4 weeks (around 12/27/2017) for OB Visit (HOB).   Jaynie CollinsUgonna Mourad Cwikla, MD

## 2017-11-29 NOTE — Patient Instructions (Signed)
Return to clinic for any scheduled appointments or obstetric concerns, or go to MAU for evaluation  

## 2017-11-30 ENCOUNTER — Encounter (HOSPITAL_COMMUNITY): Payer: Self-pay | Admitting: Obstetrics and Gynecology

## 2017-12-06 ENCOUNTER — Encounter (HOSPITAL_COMMUNITY): Payer: Self-pay

## 2017-12-07 ENCOUNTER — Other Ambulatory Visit (HOSPITAL_COMMUNITY): Payer: Self-pay | Admitting: *Deleted

## 2017-12-07 ENCOUNTER — Ambulatory Visit (HOSPITAL_COMMUNITY)
Admission: RE | Admit: 2017-12-07 | Discharge: 2017-12-07 | Disposition: A | Discharge status: Home / Self Care | Payer: Medicaid Other | Source: Ambulatory Visit | Attending: Obstetrics and Gynecology | Admitting: Obstetrics and Gynecology

## 2017-12-07 ENCOUNTER — Encounter (HOSPITAL_COMMUNITY): Payer: Self-pay

## 2017-12-07 ENCOUNTER — Other Ambulatory Visit: Payer: Self-pay | Admitting: Obstetrics and Gynecology

## 2017-12-07 DIAGNOSIS — Z3A19 19 weeks gestation of pregnancy: Secondary | ICD-10-CM | POA: Diagnosis not present

## 2017-12-07 DIAGNOSIS — O09299 Supervision of pregnancy with other poor reproductive or obstetric history, unspecified trimester: Secondary | ICD-10-CM | POA: Diagnosis present

## 2017-12-07 DIAGNOSIS — O0990 Supervision of high risk pregnancy, unspecified, unspecified trimester: Secondary | ICD-10-CM

## 2017-12-07 DIAGNOSIS — Z3689 Encounter for other specified antenatal screening: Secondary | ICD-10-CM | POA: Insufficient documentation

## 2017-12-07 DIAGNOSIS — O09529 Supervision of elderly multigravida, unspecified trimester: Secondary | ICD-10-CM | POA: Diagnosis present

## 2017-12-07 DIAGNOSIS — O34219 Maternal care for unspecified type scar from previous cesarean delivery: Secondary | ICD-10-CM

## 2017-12-07 DIAGNOSIS — Z0489 Encounter for examination and observation for other specified reasons: Secondary | ICD-10-CM

## 2017-12-07 DIAGNOSIS — IMO0002 Reserved for concepts with insufficient information to code with codable children: Secondary | ICD-10-CM

## 2017-12-07 DIAGNOSIS — I82812 Embolism and thrombosis of superficial veins of left lower extremities: Secondary | ICD-10-CM

## 2017-12-07 HISTORY — DX: Embolism and thrombosis of superficial veins of left lower extremity: I82.812

## 2017-12-14 ENCOUNTER — Telehealth: Payer: Self-pay | Admitting: *Deleted

## 2017-12-14 NOTE — Telephone Encounter (Signed)
Pt left message stating that she has vaginal irritation, itching, swelling of labia and clear discharge. The problem has been ongoing for over 1 week. She wants to know if she needs to be seen or is there any cream she can use,. Please call back.

## 2017-12-19 ENCOUNTER — Encounter: Payer: Self-pay | Admitting: Obstetrics and Gynecology

## 2017-12-25 ENCOUNTER — Encounter: Payer: Self-pay | Admitting: Obstetrics & Gynecology

## 2017-12-25 ENCOUNTER — Ambulatory Visit (INDEPENDENT_AMBULATORY_CARE_PROVIDER_SITE_OTHER): Payer: Medicaid Other | Admitting: Obstetrics & Gynecology

## 2017-12-25 VITALS — BP 127/88 | HR 97 | Wt 223.6 lb

## 2017-12-25 DIAGNOSIS — O0992 Supervision of high risk pregnancy, unspecified, second trimester: Secondary | ICD-10-CM

## 2017-12-25 DIAGNOSIS — O0990 Supervision of high risk pregnancy, unspecified, unspecified trimester: Secondary | ICD-10-CM

## 2017-12-25 DIAGNOSIS — I82812 Embolism and thrombosis of superficial veins of left lower extremities: Secondary | ICD-10-CM

## 2017-12-25 NOTE — Progress Notes (Signed)
   PRENATAL VISIT NOTE  Subjective:  Sheila Cline is a 37 y.o. G3P1101 at 4777w5d being seen today for ongoing prenatal care.  She is currently monitored for the following issues for this high-risk pregnancy and has HSV-2 (herpes simplex virus 2) infection; Supervision of high risk pregnancy, antepartum; History of placenta abruption leading to loss at 26 weeks; Pregnancy complicated by obesity; LGSIL on Pap smear of cervix; and Superficial thrombosis of left lower extremity on their problem list.  Patient reports no complaints.  Contractions: Not present. Vag. Bleeding: None.  Movement: Present. Denies leaking of fluid.   The following portions of the patient's history were reviewed and updated as appropriate: allergies, current medications, past family history, past medical history, past social history, past surgical history and problem list. Problem list updated.  Objective:   Vitals:   12/25/17 0822  BP: 127/88  Pulse: 97  Weight: 223 lb 9.6 oz (101.4 kg)    Fetal Status: Fetal Heart Rate (bpm): 152   Movement: Present     General:  Alert, oriented and cooperative. Patient is in no acute distress.  Skin: Skin is warm and dry. No rash noted.   Cardiovascular: Normal heart rate noted  Respiratory: Normal respiratory effort, no problems with respiration noted  Abdomen: Soft, gravid, appropriate for gestational age.  Pain/Pressure: Absent     Pelvic: Cervical exam deferred        Extremities: Normal range of motion.  Edema: None  Mental Status:  Normal mood and affect. Normal behavior. Normal judgment and thought content.   Assessment and Plan:  Pregnancy: G3P1101 at 6777w5d  1. Superficial thrombosis of left lower extremity Continue ASA 81 mg daily. No complaints.  2. Supervision of high risk pregnancy, antepartum Preterm labor symptoms and general obstetric precautions including but not limited to vaginal bleeding, contractions, leaking of fluid and fetal movement were reviewed  in detail with the patient. Please refer to After Visit Summary for other counseling recommendations.  Return in about 4 weeks (around 01/22/2018) for OB Visit (HOB).   Jaynie CollinsUgonna Anyanwu, MD

## 2017-12-25 NOTE — Patient Instructions (Signed)
Places to have your son circumcised:    [facility] [phone] $[phone] while you are in hospital  [facility] [phone] $244 by 4 wks  [facility] [phone] $[phone] by 2 wks  [facility] [phone] $[phone] by 7 days [facility] [phone] $[phone] by 4 wks  These prices sometimes change but are roughly what you can expect to pay. Please call and confirm pricing.   Circumcision is considered an elective/non-medically necessary procedure. There are many reasons parents decide to have their sons circumsized. During the first year of life circumcised males have a reduced risk of urinary tract infections but after this year the rates between circumcised males and uncircumcised males are the same.  It is safe to have your son circumcised outside of the hospital and the places above perform them regularly.   Deciding about Circumcision in Baby Boys  (Up-to-date The Basics)  What is circumcision?  Circumcision is a surgery that removes the skin that covers the tip of the penis, called the "foreskin" Circumcision is usually done when a boy is between 1 and 10 days old. In the [place], circumcision is common. In some other countries, fewer boys are circumcised. Circumcision is a common tradition in some religions.  Should I have my baby boy circumcised?  There is no easy answer. Circumcision has some benefits. But it also has risks. After talking with your doctor, you will have to decide for yourself what is right for your family.  What are the benefits of circumcision?  Circumcised boys seem to have slightly lower rates of: ?Urinary tract infections ?Swelling of the opening at the tip of the penis Circumcised men seem to have slightly lower rates of: ?Urinary tract infections ?Swelling of the opening at the tip of the penis ?Penis  cancer ?HIV and other infections that you catch during sex ?Cervical cancer in the women they have sex with Even so, in the [place], the risks of these problems are small - even in boys and men who have not been circumcised. Plus, boys and men who are not circumcised can reduce these extra risks by: ?Cleaning their penis well ?Using condoms during sex  What are the risks of circumcision?  Risks include: ?Bleeding or infection from the surgery ?Damage to or amputation of the penis ?A chance that the doctor will cut off too much or not enough of the foreskin ?A chance that sex won't feel as good later in life Only about 1 out of every 200 circumcisions leads to problems. There is also a chance that your health insurance won't pay for circumcision.  How is circumcision done in baby boys?  First, the baby gets medicine for pain relief. This might be a cream on the skin or a shot into the base of the penis. Next, the doctor cleans the baby's penis well. Then he or she uses special tools to cut off the foreskin. Finally, the doctor wraps a bandage (called gauze) around the baby's penis. If you have your baby circumcised, his doctor or nurse will give you instructions on how to care for him after the surgery. It is important that you follow those instructions carefully.     

## 2017-12-25 NOTE — Progress Notes (Signed)
Needs info on circumcision

## 2017-12-26 NOTE — Telephone Encounter (Signed)
Patient seen in clinic 1/22.

## 2018-01-03 ENCOUNTER — Encounter: Payer: Self-pay | Admitting: *Deleted

## 2018-01-03 DIAGNOSIS — O09219 Supervision of pregnancy with history of pre-term labor, unspecified trimester: Secondary | ICD-10-CM

## 2018-01-03 DIAGNOSIS — O09899 Supervision of other high risk pregnancies, unspecified trimester: Secondary | ICD-10-CM | POA: Insufficient documentation

## 2018-01-04 ENCOUNTER — Other Ambulatory Visit (HOSPITAL_COMMUNITY): Payer: Self-pay | Admitting: Maternal and Fetal Medicine

## 2018-01-04 ENCOUNTER — Other Ambulatory Visit (HOSPITAL_COMMUNITY): Payer: Self-pay | Admitting: *Deleted

## 2018-01-04 ENCOUNTER — Encounter (HOSPITAL_COMMUNITY): Payer: Self-pay

## 2018-01-04 ENCOUNTER — Ambulatory Visit (HOSPITAL_COMMUNITY)
Admission: RE | Admit: 2018-01-04 | Discharge: 2018-01-04 | Disposition: A | Discharge status: Home / Self Care | Payer: Medicaid Other | Source: Ambulatory Visit | Attending: Obstetrics and Gynecology | Admitting: Obstetrics and Gynecology

## 2018-01-04 DIAGNOSIS — Z0489 Encounter for examination and observation for other specified reasons: Secondary | ICD-10-CM | POA: Insufficient documentation

## 2018-01-04 DIAGNOSIS — E669 Obesity, unspecified: Secondary | ICD-10-CM | POA: Diagnosis not present

## 2018-01-04 DIAGNOSIS — O09299 Supervision of pregnancy with other poor reproductive or obstetric history, unspecified trimester: Secondary | ICD-10-CM

## 2018-01-04 DIAGNOSIS — O09522 Supervision of elderly multigravida, second trimester: Secondary | ICD-10-CM | POA: Diagnosis not present

## 2018-01-04 DIAGNOSIS — O09292 Supervision of pregnancy with other poor reproductive or obstetric history, second trimester: Secondary | ICD-10-CM | POA: Insufficient documentation

## 2018-01-04 DIAGNOSIS — O99212 Obesity complicating pregnancy, second trimester: Secondary | ICD-10-CM

## 2018-01-04 DIAGNOSIS — O09529 Supervision of elderly multigravida, unspecified trimester: Secondary | ICD-10-CM

## 2018-01-04 DIAGNOSIS — Z3A23 23 weeks gestation of pregnancy: Secondary | ICD-10-CM | POA: Diagnosis not present

## 2018-01-04 DIAGNOSIS — IMO0002 Reserved for concepts with insufficient information to code with codable children: Secondary | ICD-10-CM

## 2018-01-15 ENCOUNTER — Encounter: Payer: Self-pay | Admitting: Obstetrics & Gynecology

## 2018-01-16 ENCOUNTER — Telehealth: Payer: Self-pay | Admitting: General Practice

## 2018-01-16 ENCOUNTER — Telehealth: Payer: Self-pay

## 2018-01-16 DIAGNOSIS — J111 Influenza due to unidentified influenza virus with other respiratory manifestations: Secondary | ICD-10-CM

## 2018-01-16 MED ORDER — OSELTAMIVIR PHOSPHATE 75 MG PO CAPS: 75.0000 mg | ORAL_CAPSULE | Freq: Every day | ORAL | 0 refills | Status: AC

## 2018-01-16 NOTE — Telephone Encounter (Signed)
Opened in error

## 2018-01-16 NOTE — Telephone Encounter (Signed)
Called patient regarding mychart message. Patient states she wonders if she should take the tamiflu or not and if so, is it related to her abruption last year. Told patient there isn't an association with tamiflu and harm to pregnancies. Told patient tamiflu is recommended to help protect her from getting the flu which could cause problems if she gets it. tamiflu ordered per Dr Nira Retortegele & I informed the patient. Also discussed prevention techniques as well. Patient verbalized understanding to all & had no questions

## 2018-01-22 ENCOUNTER — Ambulatory Visit (INDEPENDENT_AMBULATORY_CARE_PROVIDER_SITE_OTHER): Payer: Medicaid Other | Admitting: Obstetrics and Gynecology

## 2018-01-22 ENCOUNTER — Encounter: Payer: Self-pay | Admitting: Obstetrics and Gynecology

## 2018-01-22 VITALS — BP 125/82 | HR 96 | Wt 230.0 lb

## 2018-01-22 DIAGNOSIS — Z20828 Contact with and (suspected) exposure to other viral communicable diseases: Secondary | ICD-10-CM

## 2018-01-22 DIAGNOSIS — B009 Herpesviral infection, unspecified: Secondary | ICD-10-CM

## 2018-01-22 DIAGNOSIS — R87612 Low grade squamous intraepithelial lesion on cytologic smear of cervix (LGSIL): Secondary | ICD-10-CM | POA: Diagnosis not present

## 2018-01-22 DIAGNOSIS — O368120 Decreased fetal movements, second trimester, not applicable or unspecified: Secondary | ICD-10-CM

## 2018-01-22 DIAGNOSIS — Z8759 Personal history of other complications of pregnancy, childbirth and the puerperium: Secondary | ICD-10-CM

## 2018-01-22 DIAGNOSIS — O0990 Supervision of high risk pregnancy, unspecified, unspecified trimester: Secondary | ICD-10-CM

## 2018-01-22 DIAGNOSIS — I82812 Embolism and thrombosis of superficial veins of left lower extremities: Secondary | ICD-10-CM

## 2018-01-22 DIAGNOSIS — O0992 Supervision of high risk pregnancy, unspecified, second trimester: Secondary | ICD-10-CM | POA: Diagnosis not present

## 2018-01-22 NOTE — Progress Notes (Signed)
   PRENATAL VISIT NOTE  Subjective:  Sheila Cline is a 37 y.o. G3P1101 at 7255w5d being seen today for ongoing prenatal care.  She is currently monitored for the following issues for this high-risk pregnancy and has HSV-2 (herpes simplex virus 2) infection; Supervision of high risk pregnancy, antepartum; History of placenta abruption leading to loss at 26 weeks; Pregnancy complicated by obesity; LGSIL on Pap smear of cervix; Superficial thrombosis of left lower extremity; and Exposure to the flu on their problem list.  Patient reports some pelvic pressure and stretching.  Contractions: Not present. Vag. Bleeding: None.  Movement: (!) Decreased. Denies leaking of fluid. Patient reports she is very worried as she had her IUFD with placental abruption at 26 weeks with last pregnancy.  The following portions of the patient's history were reviewed and updated as appropriate: allergies, current medications, past family history, past medical history, past social history, past surgical history and problem list. Problem list updated.  Objective:   Vitals:   01/22/18 0821  BP: 125/82  Pulse: 96  Weight: 230 lb (104.3 kg)    Fetal Status: Fetal Heart Rate (bpm): 154   Movement: (!) Decreased     General:  Alert, oriented and cooperative. Patient is in no acute distress.  Skin: Skin is warm and dry. No rash noted.   Cardiovascular: Normal heart rate noted  Respiratory: Normal respiratory effort, no problems with respiration noted  Abdomen: Soft, gravid, appropriate for gestational age.  Pain/Pressure: Present     Pelvic: Cervical exam deferred        Extremities: Normal range of motion.  Edema: Trace  Mental Status:  Normal mood and affect. Normal behavior. Normal judgment and thought content.   Assessment and Plan:  Pregnancy: G3P1101 at 3355w5d  1. Superficial thrombosis of left lower extremity S/p lovenox, now on ASA  2. History of placenta abruption leading to loss at 26 weeks Negative  workup  3. Supervision of high risk pregnancy, antepartum Decreased FM - will do NST today NST reactive  4. HSV-2 (herpes simplex virus 2) infection - Patient unsure if she has ever had outbreak, husband has regular outbreaks - Reviewed risks/benefits of testing for HSV antibodies, she would like to get tested today - HSV IgG/IgM today  5. LGSIL on Pap smear of cervix Repeat pap pp  6. Exposure to the flu 37 yr old daughter with flu, patient without symptoms, has been on tamiflu   Preterm labor symptoms and general obstetric precautions including but not limited to vaginal bleeding, contractions, leaking of fluid and fetal movement were reviewed in detail with the patient. Please refer to After Visit Summary for other counseling recommendations.  Return in about 3 weeks (around 02/12/2018) for OB visit (MD), 2 hr GTT, 3rd trim labs.   Conan BowensKelly M Davis, MD

## 2018-01-24 LAB — HSV(HERPES SMPLX)ABS-I+II(IGG+IGM)-BLD
HSV 1 Glycoprotein G Ab, IgG: <0.91 index (ref 0.00–0.90)
HSV 2 IGG, TYPE SPEC: 7.64 {index} — AB (ref 0.00–0.90)
HSVI/II Comb IgM: <0.91 Ratio (ref 0.00–0.90)

## 2018-02-08 ENCOUNTER — Encounter (HOSPITAL_COMMUNITY): Payer: Self-pay | Admitting: *Deleted

## 2018-02-08 ENCOUNTER — Other Ambulatory Visit: Payer: Self-pay

## 2018-02-08 ENCOUNTER — Ambulatory Visit (HOSPITAL_COMMUNITY)
Admission: RE | Admit: 2018-02-08 | Discharge: 2018-02-08 | Disposition: A | Discharge status: Home / Self Care | Payer: Medicaid Other | Source: Ambulatory Visit | Attending: Obstetrics and Gynecology | Admitting: Obstetrics and Gynecology

## 2018-02-08 ENCOUNTER — Other Ambulatory Visit (HOSPITAL_COMMUNITY): Payer: Self-pay | Admitting: Maternal and Fetal Medicine

## 2018-02-08 ENCOUNTER — Other Ambulatory Visit (HOSPITAL_COMMUNITY): Payer: Self-pay | Admitting: *Deleted

## 2018-02-08 ENCOUNTER — Encounter (HOSPITAL_COMMUNITY): Payer: Self-pay

## 2018-02-08 ENCOUNTER — Inpatient Hospital Stay (HOSPITAL_COMMUNITY)
Admission: AD | Admit: 2018-02-08 | Discharge: 2018-02-08 | Disposition: A | Discharge status: Home / Self Care | Payer: Medicaid Other | Source: Ambulatory Visit | Attending: Obstetrics and Gynecology | Admitting: Obstetrics and Gynecology

## 2018-02-08 DIAGNOSIS — O09523 Supervision of elderly multigravida, third trimester: Secondary | ICD-10-CM | POA: Insufficient documentation

## 2018-02-08 DIAGNOSIS — I10 Essential (primary) hypertension: Secondary | ICD-10-CM | POA: Diagnosis present

## 2018-02-08 DIAGNOSIS — Z3A28 28 weeks gestation of pregnancy: Secondary | ICD-10-CM

## 2018-02-08 DIAGNOSIS — O09293 Supervision of pregnancy with other poor reproductive or obstetric history, third trimester: Secondary | ICD-10-CM | POA: Diagnosis not present

## 2018-02-08 DIAGNOSIS — Z20828 Contact with and (suspected) exposure to other viral communicable diseases: Secondary | ICD-10-CM

## 2018-02-08 DIAGNOSIS — Z8249 Family history of ischemic heart disease and other diseases of the circulatory system: Secondary | ICD-10-CM | POA: Insufficient documentation

## 2018-02-08 DIAGNOSIS — Z825 Family history of asthma and other chronic lower respiratory diseases: Secondary | ICD-10-CM | POA: Diagnosis not present

## 2018-02-08 DIAGNOSIS — Z8759 Personal history of other complications of pregnancy, childbirth and the puerperium: Secondary | ICD-10-CM

## 2018-02-08 DIAGNOSIS — I82812 Embolism and thrombosis of superficial veins of left lower extremities: Secondary | ICD-10-CM

## 2018-02-08 DIAGNOSIS — R87612 Low grade squamous intraepithelial lesion on cytologic smear of cervix (LGSIL): Secondary | ICD-10-CM

## 2018-02-08 DIAGNOSIS — O26893 Other specified pregnancy related conditions, third trimester: Secondary | ICD-10-CM | POA: Insufficient documentation

## 2018-02-08 DIAGNOSIS — O0990 Supervision of high risk pregnancy, unspecified, unspecified trimester: Secondary | ICD-10-CM

## 2018-02-08 DIAGNOSIS — O09299 Supervision of pregnancy with other poor reproductive or obstetric history, unspecified trimester: Secondary | ICD-10-CM

## 2018-02-08 DIAGNOSIS — O09529 Supervision of elderly multigravida, unspecified trimester: Secondary | ICD-10-CM

## 2018-02-08 DIAGNOSIS — R030 Elevated blood-pressure reading, without diagnosis of hypertension: Secondary | ICD-10-CM | POA: Diagnosis not present

## 2018-02-08 DIAGNOSIS — Z98891 History of uterine scar from previous surgery: Secondary | ICD-10-CM

## 2018-02-08 DIAGNOSIS — O9989 Other specified diseases and conditions complicating pregnancy, childbirth and the puerperium: Secondary | ICD-10-CM

## 2018-02-08 DIAGNOSIS — O99210 Obesity complicating pregnancy, unspecified trimester: Secondary | ICD-10-CM

## 2018-02-08 DIAGNOSIS — Z7982 Long term (current) use of aspirin: Secondary | ICD-10-CM | POA: Diagnosis not present

## 2018-02-08 LAB — COMPREHENSIVE METABOLIC PANEL
ALT: 18 U/L (ref 14–54)
AST: 17 U/L (ref 15–41)
Albumin: 3.2 g/dL — ABNORMAL LOW (ref 3.5–5.0)
Alkaline Phosphatase: 110 U/L (ref 38–126)
Anion gap: 7 (ref 5–15)
BUN: 10 mg/dL (ref 6–20)
CHLORIDE: 106 mmol/L (ref 101–111)
CO2: 21 mmol/L — ABNORMAL LOW (ref 22–32)
Calcium: 9 mg/dL (ref 8.9–10.3)
Creatinine, Ser: 0.54 mg/dL (ref 0.44–1.00)
Glucose, Bld: 89 mg/dL (ref 65–99)
POTASSIUM: 3.9 mmol/L (ref 3.5–5.1)
Sodium: 134 mmol/L — ABNORMAL LOW (ref 135–145)
TOTAL PROTEIN: 6.9 g/dL (ref 6.5–8.1)
Total Bilirubin: 0.5 mg/dL (ref 0.3–1.2)

## 2018-02-08 LAB — URINALYSIS, ROUTINE W REFLEX MICROSCOPIC
BILIRUBIN URINE: NEGATIVE
GLUCOSE, UA: NEGATIVE mg/dL
HGB URINE DIPSTICK: NEGATIVE
Ketones, ur: NEGATIVE mg/dL
NITRITE: NEGATIVE
Protein, ur: NEGATIVE mg/dL
SPECIFIC GRAVITY, URINE: 1.01 (ref 1.005–1.030)
pH: 7 (ref 5.0–8.0)

## 2018-02-08 LAB — CBC
HCT: 36.5 % (ref 36.0–46.0)
Hemoglobin: 12.3 g/dL (ref 12.0–15.0)
MCH: 28.7 pg (ref 26.0–34.0)
MCHC: 33.7 g/dL (ref 30.0–36.0)
MCV: 85.3 fL (ref 78.0–100.0)
PLATELETS: 220 10*3/uL (ref 150–400)
RBC: 4.28 MIL/uL (ref 3.87–5.11)
RDW: 14 % (ref 11.5–15.5)
WBC: 16 10*3/uL — AB (ref 4.0–10.5)

## 2018-02-08 LAB — PROTEIN / CREATININE RATIO, URINE
CREATININE, URINE: 43 mg/dL
PROTEIN CREATININE RATIO: 0.23 mg/mg{creat} — AB (ref 0.00–0.15)
TOTAL PROTEIN, URINE: 10 mg/dL

## 2018-02-08 NOTE — Progress Notes (Signed)
FHR note tracing

## 2018-02-08 NOTE — Discharge Instructions (Signed)
Hypertension During Pregnancy °Hypertension, commonly called high blood pressure, is when the force of blood pumping through your arteries is too strong. Arteries are blood vessels that carry blood from the heart throughout the body. Hypertension during pregnancy can cause problems for you and your baby. Your baby may be born early (prematurely) or may not weigh as much as he or she should at birth. Very bad cases of hypertension during pregnancy can be life-threatening. °Different types of hypertension can occur during pregnancy. These include: °· Chronic hypertension. This happens when: °? You have hypertension before pregnancy and it continues during pregnancy. °? You develop hypertension before you are [redacted] weeks pregnant, and it continues during pregnancy. °· Gestational hypertension. This is hypertension that develops after the 20th week of pregnancy. °· Preeclampsia, also called toxemia of pregnancy. This is a very serious type of hypertension that develops only during pregnancy. It affects the whole body, and it can be very dangerous for you and your baby. ° °Gestational hypertension and preeclampsia usually go away within 6 weeks after your baby is born. Women who have hypertension during pregnancy have a greater chance of developing hypertension later in life or during future pregnancies. °What are the causes? °The exact cause of hypertension is not known. °What increases the risk? °There are certain factors that make it more likely for you to develop hypertension during pregnancy. These include: °· Having hypertension during a previous pregnancy or prior to pregnancy. °· Being overweight. °· Being older than age 40. °· Being pregnant for the first time or being pregnant with more than one baby. °· Becoming pregnant using fertilization methods such as IVF (in vitro fertilization). °· Having diabetes, kidney problems, or systemic lupus erythematosus. °· Having a family history of hypertension. ° °What are the  signs or symptoms? °Chronic hypertension and gestational hypertension rarely cause symptoms. Preeclampsia causes symptoms, which may include: °· Increased protein in your urine. Your health care provider will check for this at every visit before you give birth (prenatal visit). °· Severe headaches. °· Sudden weight gain. °· Swelling of the hands, face, legs, and feet. °· Nausea and vomiting. °· Vision problems, such as blurred or double vision. °· Numbness in the face, arms, legs, and feet. °· Dizziness. °· Slurred speech. °· Sensitivity to bright lights. °· Abdominal pain. °· Convulsions. ° °How is this diagnosed? °You may be diagnosed with hypertension during a routine prenatal exam. At each prenatal visit, you may: °· Have a urine test to check for high amounts of protein in your urine. °· Have your blood pressure checked. A blood pressure reading is recorded as two numbers, such as "120 over 80" (or 120/80). The first ("top") number is called the systolic pressure. It is a measure of the pressure in your arteries when your heart beats. The second ("bottom") number is called the diastolic pressure. It is a measure of the pressure in your arteries as your heart relaxes between beats. Blood pressure is measured in a unit called mm Hg. A normal blood pressure reading is: °? Systolic: below 120. °? Diastolic: below 80. ° °The type of hypertension that you are diagnosed with depends on your test results and when your symptoms developed. °· Chronic hypertension is usually diagnosed before 20 weeks of pregnancy. °· Gestational hypertension is usually diagnosed after 20 weeks of pregnancy. °· Hypertension with high amounts of protein in the urine is diagnosed as preeclampsia. °· Blood pressure measurements that stay above 160 systolic, or above 110 diastolic, are   signs of severe preeclampsia. ° °How is this treated? °Treatment for hypertension during pregnancy varies depending on the type of hypertension you have and how  serious it is. °· If you take medicines called ACE inhibitors to treat chronic hypertension, you may need to switch medicines. ACE inhibitors should not be taken during pregnancy. °· If you have gestational hypertension, you may need to take blood pressure medicine. °· If you are at risk for preeclampsia, your health care provider may recommend that you take a low-dose aspirin every day to prevent high blood pressure during your pregnancy. °· If you have severe preeclampsia, you may need to be hospitalized so you and your baby can be monitored closely. You may also need to take medicine (magnesium sulfate) to prevent seizures and to lower blood pressure. This medicine may be given as an injection or through an IV tube. °· In some cases, if your condition gets worse, you may need to deliver your baby early. ° °Follow these instructions at home: °Eating and drinking °· Drink enough fluid to keep your urine clear or pale yellow. °· Eat a healthy diet that is low in salt (sodium). Do not add salt to your food. Check food labels to see how much sodium a food or beverage contains. °Lifestyle °· Do not use any products that contain nicotine or tobacco, such as cigarettes and e-cigarettes. If you need help quitting, ask your health care provider. °· Do not use alcohol. °· Avoid caffeine. °· Avoid stress as much as possible. Rest and get plenty of sleep. °General instructions °· Take over-the-counter and prescription medicines only as told by your health care provider. °· While lying down, lie on your left side. This keeps pressure off your baby. °· While sitting or lying down, raise (elevate) your feet. Try putting some pillows under your lower legs. °· Exercise regularly. Ask your health care provider what kinds of exercise are best for you. °· Keep all prenatal and follow-up visits as told by your health care provider. This is important. °Contact a health care provider if: °· You have symptoms that your health care  provider told you may require more treatment or monitoring, such as: °? Fever. °? Vomiting. °? Headache. °Get help right away if: °· You have severe abdominal pain or vomiting that does not get better with treatment. °· You suddenly develop swelling in your hands, ankles, or face. °· You gain 4 lbs (1.8 kg) or more in 1 week. °· You develop vaginal bleeding, or you have blood in your urine. °· You do not feel your baby moving as much as usual. °· You have blurred or double vision. °· You have muscle twitching or sudden tightening (spasms). °· You have shortness of breath. °· Your lips or fingernails turn blue. °This information is not intended to replace advice given to you by your health care provider. Make sure you discuss any questions you have with your health care provider. °Document Released: 08/09/2011 Document Revised: 06/10/2016 Document Reviewed: 05/06/2016 °Elsevier Interactive Patient Education © 2018 [employer] ° °

## 2018-02-08 NOTE — MAU Note (Signed)
Pt sent from MFM for BP eval.  Pt denies H/a, visual disturbances, or epigastric pain.  Reports some swelling in hands in the morning when she wakes up, states that just started this week. Report +FM.  Denies VB or LOF.

## 2018-02-08 NOTE — MAU Provider Note (Signed)
History     CSN: [id number]  Arrival date and time: 02/08/18 [id number]   First Provider Initiated Contact with Patient 02/08/18 1048      Chief Complaint  Patient presents with  . Hypertension   HPI Sheila Cline is a 37 y.o. G3P1101 at 3127w1d who presents from MFM for BP evaluation. Denies any history of hypertension. Hx of 26 wk abruption with IUFD last year. Denies headache, visual disturbance, or epigastric pain. Denies abdominal pain or vaginal bleeding. Positive fetal movement.   OB History    Gravida Para Term Preterm AB Living   3 2 1 1   1    SAB TAB Ectopic Multiple Live Births           1      Past Medical History:  Diagnosis Date  . Chlamydia   . Chronic interstitial cystitis 02/25/2013  . Dysplasia of cervix, low grade (CIN 1)   . HPV (human papilloma virus) infection   . HSV-2 (herpes simplex virus 2) infection   . Interstitial cystitis   . OAB (overactive bladder) 09/24/2013  . Stress at home 09/24/2013  . Superficial thrombosis of left lower extremity 09/2017    Past Surgical History:  Procedure Laterality Date  . CESAREAN SECTION    . tubes in ears      Family History  Problem Relation Age of Onset  . Interstitial cystitis Maternal Grandmother   . Stroke Maternal Grandmother   . Heart disease Maternal Grandmother   . Cancer Maternal Aunt        breast  . Hypertension Mother   . Asthma Mother   . Asthma Brother   . Asthma Daughter   . Hypertension Maternal Grandfather     Social History   Tobacco Use  . Smoking status: Never Smoker  . Smokeless tobacco: Never Used  Substance Use Topics  . Alcohol use: No    Comment: occ wine; not now  . Drug use: No    Allergies: No Known Allergies  Medications Prior to Admission  Medication Sig Dispense Refill Last Dose  . aspirin EC 81 MG tablet Take 1 tablet (81 mg total) by mouth daily. Take after 12 weeks for prevention of preeclampsia later in pregnancy 300 tablet 2 02/08/2018 at Unknown time  .  FOLIC ACID PO Take by mouth.   02/07/2018 at Unknown time  . Prenatal MV-Min-FA-Omega-3 (PRENATAL GUMMIES/DHA & FA) 0.4-32.5 MG CHEW Chew 2 each by mouth daily.   02/07/2018 at Unknown time    Review of Systems  Constitutional: Negative.   Eyes: Negative.   Gastrointestinal: Negative.   Neurological: Negative.    Physical Exam   Blood pressure (!) 105/39, pulse 82, temperature 98.1 F (36.7 C), temperature source Oral, resp. rate 18, height 5' (1.524 m), weight 237 lb (107.5 kg), last menstrual period 07/26/2017, unknown if currently breastfeeding. Patient Vitals for the past 24 hrs:  BP Temp Temp src Pulse Resp Height Weight  02/08/18 1257 - 98.1 F (36.7 C) Oral - 18 - -  02/08/18 1231 (!) 105/39 - - 82 - - -  02/08/18 1216 127/86 - - 87 - - -  02/08/18 1201 128/86 - - 90 - - -  02/08/18 1145 122/80 - - 88 - - -  02/08/18 1130 125/74 - - 82 - - -  02/08/18 1115 133/78 - - 88 - - -  02/08/18 1101 (!) 128/93 - - 92 - - -  02/08/18 1056 (!) 152/102 - -  91 - - -  02/08/18 1046 (!) 153/110 - - 93 - - -  02/08/18 1039 (!) 154/99 - - 81 - - -  02/08/18 1015 (!) 154/105 98.2 F (36.8 C) Oral 83 20 5' (1.524 m) 237 lb (107.5 kg)    Physical Exam  Nursing note and vitals reviewed. Constitutional: She is oriented to person, place, and time. She appears well-developed and well-nourished. No distress.  HENT:  Head: Normocephalic and atraumatic.  Eyes: Conjunctivae are normal. Right eye exhibits no discharge. Left eye exhibits no discharge. No scleral icterus.  Neck: Normal range of motion.  Cardiovascular: Normal rate, regular rhythm and normal heart sounds.  No murmur heard. Respiratory: Effort normal and breath sounds normal. No respiratory distress. She has no wheezes.  GI: Soft. There is no tenderness.  Musculoskeletal: She exhibits edema.  Neurological: She is alert and oriented to person, place, and time. She has normal reflexes.  No clonus  Skin: Skin is warm and dry. She is not  diaphoretic.  Psychiatric: She has a normal mood and affect. Her behavior is normal. Judgment and thought content normal.    MAU Course  Procedures Results for orders placed or performed during the hospital encounter of 02/08/18 (from the past 24 hour(s))  Urinalysis, Routine w reflex microscopic     Status: Abnormal   Collection Time: 02/08/18 10:22 AM  Result Value Ref Range   Color, Urine YELLOW YELLOW   APPearance HAZY (A) CLEAR   Specific Gravity, Urine 1.010 1.005 - 1.030   pH 7.0 5.0 - 8.0   Glucose, UA NEGATIVE NEGATIVE mg/dL   Hgb urine dipstick NEGATIVE NEGATIVE   Bilirubin Urine NEGATIVE NEGATIVE   Ketones, ur NEGATIVE NEGATIVE mg/dL   Protein, ur NEGATIVE NEGATIVE mg/dL   Nitrite NEGATIVE NEGATIVE   Leukocytes, UA LARGE (A) NEGATIVE   RBC / HPF 0-5 0 - 5 RBC/hpf   WBC, UA 0-5 0 - 5 WBC/hpf   Bacteria, UA RARE (A) NONE SEEN   Squamous Epithelial / LPF 0-5 (A) NONE SEEN   Mucus PRESENT   Protein / creatinine ratio, urine     Status: Abnormal   Collection Time: 02/08/18 10:22 AM  Result Value Ref Range   Creatinine, Urine 43.00 mg/dL   Total Protein, Urine 10 mg/dL   Protein Creatinine Ratio 0.23 (H) 0.00 - 0.15 mg/mg[Cre]  CBC     Status: Abnormal   Collection Time: 02/08/18 10:56 AM  Result Value Ref Range   WBC 16.0 (H) 4.0 - 10.5 K/uL   RBC 4.28 3.87 - 5.11 MIL/uL   Hemoglobin 12.3 12.0 - 15.0 g/dL   HCT 16.1 09.6 - 04.5 %   MCV 85.3 78.0 - 100.0 fL   MCH 28.7 26.0 - 34.0 pg   MCHC 33.7 30.0 - 36.0 g/dL   RDW 40.9 81.1 - 91.4 %   Platelets 220 150 - 400 K/uL  Comprehensive metabolic panel     Status: Abnormal   Collection Time: 02/08/18 10:56 AM  Result Value Ref Range   Sodium 134 (L) 135 - 145 mmol/L   Potassium 3.9 3.5 - 5.1 mmol/L   Chloride 106 101 - 111 mmol/L   CO2 21 (L) 22 - 32 mmol/L   Glucose, Bld 89 65 - 99 mg/dL   BUN 10 6 - 20 mg/dL   Creatinine, Ser 7.82 0.44 - 1.00 mg/dL   Calcium 9.0 8.9 - 95.6 mg/dL   Total Protein 6.9 6.5 - 8.1  g/dL   Albumin  3.2 (L) 3.5 - 5.0 g/dL   AST 17 15 - 41 U/L   ALT 18 14 - 54 U/L   Alkaline Phosphatase 110 38 - 126 U/L   Total Bilirubin 0.5 0.3 - 1.2 mg/dL   GFR calc non Af Amer >60 >60 mL/min   GFR calc Af Amer >60 >60 mL/min   Anion gap 7 5 - 15    MDM NST:  Baseline: 145 bpm, Variability: Good {> 6 bpm), Accelerations: Reactive and Decelerations: Absent Elevated BP, severe range x 1. Last 6 BPs normal range CBC, CMP, urine PCR C/w Dr. Vergie Living. Pt to go to office tomorrow morning for BP check.   Assessment and Plan  A: 1. Elevated BP without diagnosis of hypertension   2. [redacted] weeks gestation of pregnancy    P: Discharge home Go to [facility] tomorrow at 10 am for BP check Discussed reasons to return to MAU  Sheila Cline 02/08/2018, 10:48 AM

## 2018-02-08 NOTE — ED Notes (Signed)
Report called to Dicky DoeFelicia Morris, RN.  Pt/friend ambulated to MAU and registered for further monitoring per Dr. Sherrie Georgeecker.

## 2018-02-09 ENCOUNTER — Ambulatory Visit: Payer: Medicaid Other | Admitting: *Deleted

## 2018-02-09 VITALS — BP 134/87 | HR 96

## 2018-02-09 DIAGNOSIS — O139 Gestational [pregnancy-induced] hypertension without significant proteinuria, unspecified trimester: Secondary | ICD-10-CM

## 2018-02-09 NOTE — Progress Notes (Signed)
I have reviewed this chart and agree with the RN/CMA assessment and management.   Given new onset likely gestational HTN (has only had elevated BP one day) and h/o abruption at 26 weeks with IUFD, will start weekly testing. Patient had reactive NST and appropriate AFI on MFM scan yesterday. Scheduled for BPP on 02/13/18. Reviewed precautions.   Baldemar LenisK. Meryl Davis, M.D. Attending Obstetrician & Gynecologist, [facility], [facility]

## 2018-02-09 NOTE — Progress Notes (Signed)
Here for bp check due to elevation yesterday in [facility]today denies headache, states has a little puffiness in hands  In am that goes away during the day.  BP today 134/84 reported to Dr. Earlene Plateravis. Ordered BPP and bp check 02/13/18. Also instructed patient to come to mau for symptoms of elevated bp. She voices understanding.

## 2018-02-13 ENCOUNTER — Ambulatory Visit: Payer: Self-pay

## 2018-02-13 ENCOUNTER — Ambulatory Visit (INDEPENDENT_AMBULATORY_CARE_PROVIDER_SITE_OTHER): Payer: Medicaid Other | Admitting: *Deleted

## 2018-02-13 VITALS — BP 144/95 | HR 81

## 2018-02-13 DIAGNOSIS — O139 Gestational [pregnancy-induced] hypertension without significant proteinuria, unspecified trimester: Secondary | ICD-10-CM

## 2018-02-13 DIAGNOSIS — O133 Gestational [pregnancy-induced] hypertension without significant proteinuria, third trimester: Secondary | ICD-10-CM

## 2018-02-13 NOTE — Progress Notes (Signed)
Chart reviewed - agree with RN documentation.   

## 2018-02-13 NOTE — Progress Notes (Signed)
Pt informed that the ultrasound is considered a limited OB ultrasound and is not intended to be a complete ultrasound exam.  Patient also informed that the ultrasound is not being completed with the intent of assessing for fetal or placental anomalies or any pelvic abnormalities.  Explained that the purpose of today's ultrasound is to assess for presentation, BPP and amniotic fluid volume.  Patient acknowledges the purpose of the exam and the limitations of the study.    She denies H/A or visual disturbances. Pt status reported to Dr. Adrian BlackwaterStinson - Pre-e labs ordered. Pt advised of reasons to return to hospital and voiced understanding.

## 2018-02-14 LAB — CBC
HEMATOCRIT: 36.1 % (ref 34.0–46.6)
HEMOGLOBIN: 12 g/dL (ref 11.1–15.9)
MCH: 28.9 pg (ref 26.6–33.0)
MCHC: 33.2 g/dL (ref 31.5–35.7)
MCV: 87 fL (ref 79–97)
Platelets: 194 10*3/uL (ref 150–379)
RBC: 4.15 x10E6/uL (ref 3.77–5.28)
RDW: 14.2 % (ref 12.3–15.4)
WBC: 13.3 10*3/uL — ABNORMAL HIGH (ref 3.4–10.8)

## 2018-02-14 LAB — COMPREHENSIVE METABOLIC PANEL
ALBUMIN: 3.4 g/dL — AB (ref 3.5–5.5)
ALT: 13 IU/L (ref 0–32)
AST: 15 IU/L (ref 0–40)
Albumin/Globulin Ratio: 1.4 (ref 1.2–2.2)
Alkaline Phosphatase: 125 IU/L — ABNORMAL HIGH (ref 39–117)
BUN / CREAT RATIO: 18 (ref 9–23)
BUN: 10 mg/dL (ref 6–20)
Bilirubin Total: <0.2 mg/dL (ref 0.0–1.2)
CO2: 20 mmol/L (ref 20–29)
CREATININE: 0.57 mg/dL (ref 0.57–1.00)
Calcium: 9.2 mg/dL (ref 8.7–10.2)
Chloride: 104 mmol/L (ref 96–106)
GFR, EST AFRICAN AMERICAN: 138 mL/min/{1.73_m2} (ref >59)
GFR, EST NON AFRICAN AMERICAN: 120 mL/min/{1.73_m2} (ref >59)
GLUCOSE: 95 mg/dL (ref 65–99)
Globulin, Total: 2.5 g/dL (ref 1.5–4.5)
Potassium: 4.5 mmol/L (ref 3.5–5.2)
Sodium: 139 mmol/L (ref 134–144)
TOTAL PROTEIN: 5.9 g/dL — AB (ref 6.0–8.5)

## 2018-02-14 LAB — PROTEIN / CREATININE RATIO, URINE
Creatinine, Urine: 114.8 mg/dL
Protein, Ur: 52.2 mg/dL
Protein/Creat Ratio: 455 mg/g creat — ABNORMAL HIGH (ref 0–200)

## 2018-02-15 ENCOUNTER — Encounter: Payer: Self-pay | Admitting: Family Medicine

## 2018-02-15 DIAGNOSIS — O1493 Unspecified pre-eclampsia, third trimester: Secondary | ICD-10-CM | POA: Insufficient documentation

## 2018-02-19 ENCOUNTER — Encounter (HOSPITAL_COMMUNITY): Payer: Self-pay

## 2018-02-19 ENCOUNTER — Encounter: Payer: Self-pay | Admitting: Obstetrics and Gynecology

## 2018-02-19 ENCOUNTER — Other Ambulatory Visit: Payer: Medicaid Other

## 2018-02-19 ENCOUNTER — Ambulatory Visit: Payer: Medicaid Other | Admitting: *Deleted

## 2018-02-19 ENCOUNTER — Ambulatory Visit (INDEPENDENT_AMBULATORY_CARE_PROVIDER_SITE_OTHER): Payer: Medicaid Other | Admitting: Obstetrics and Gynecology

## 2018-02-19 ENCOUNTER — Inpatient Hospital Stay (HOSPITAL_COMMUNITY)
Admission: AD | Admit: 2018-02-19 | Discharge: 2018-03-06 | DRG: 788 | Disposition: A | Discharge status: Home / Self Care | Payer: Medicaid Other | Source: Ambulatory Visit | Attending: Obstetrics & Gynecology | Admitting: Obstetrics & Gynecology

## 2018-02-19 ENCOUNTER — Inpatient Hospital Stay (HOSPITAL_COMMUNITY): Payer: Medicaid Other

## 2018-02-19 ENCOUNTER — Other Ambulatory Visit: Payer: Self-pay

## 2018-02-19 VITALS — BP 156/94 | HR 72 | Wt 243.1 lb

## 2018-02-19 DIAGNOSIS — O1403 Mild to moderate pre-eclampsia, third trimester: Secondary | ICD-10-CM

## 2018-02-19 DIAGNOSIS — O0990 Supervision of high risk pregnancy, unspecified, unspecified trimester: Secondary | ICD-10-CM

## 2018-02-19 DIAGNOSIS — O288 Other abnormal findings on antenatal screening of mother: Secondary | ICD-10-CM

## 2018-02-19 DIAGNOSIS — O1490 Unspecified pre-eclampsia, unspecified trimester: Secondary | ICD-10-CM

## 2018-02-19 DIAGNOSIS — O289 Unspecified abnormal findings on antenatal screening of mother: Secondary | ICD-10-CM | POA: Diagnosis present

## 2018-02-19 DIAGNOSIS — Z8759 Personal history of other complications of pregnancy, childbirth and the puerperium: Secondary | ICD-10-CM

## 2018-02-19 DIAGNOSIS — Z3A29 29 weeks gestation of pregnancy: Secondary | ICD-10-CM

## 2018-02-19 DIAGNOSIS — O1414 Severe pre-eclampsia complicating childbirth: Secondary | ICD-10-CM | POA: Diagnosis present

## 2018-02-19 DIAGNOSIS — E669 Obesity, unspecified: Secondary | ICD-10-CM | POA: Diagnosis present

## 2018-02-19 DIAGNOSIS — O1493 Unspecified pre-eclampsia, third trimester: Secondary | ICD-10-CM | POA: Diagnosis present

## 2018-02-19 DIAGNOSIS — Z20828 Contact with and (suspected) exposure to other viral communicable diseases: Secondary | ICD-10-CM

## 2018-02-19 DIAGNOSIS — O321XX0 Maternal care for breech presentation, not applicable or unspecified: Secondary | ICD-10-CM | POA: Diagnosis present

## 2018-02-19 DIAGNOSIS — O34211 Maternal care for low transverse scar from previous cesarean delivery: Secondary | ICD-10-CM | POA: Diagnosis present

## 2018-02-19 DIAGNOSIS — Z3A31 31 weeks gestation of pregnancy: Secondary | ICD-10-CM

## 2018-02-19 DIAGNOSIS — I82812 Embolism and thrombosis of superficial veins of left lower extremities: Secondary | ICD-10-CM

## 2018-02-19 DIAGNOSIS — O99214 Obesity complicating childbirth: Secondary | ICD-10-CM | POA: Diagnosis present

## 2018-02-19 DIAGNOSIS — R87612 Low grade squamous intraepithelial lesion on cytologic smear of cervix (LGSIL): Secondary | ICD-10-CM

## 2018-02-19 DIAGNOSIS — O139 Gestational [pregnancy-induced] hypertension without significant proteinuria, unspecified trimester: Secondary | ICD-10-CM

## 2018-02-19 DIAGNOSIS — R030 Elevated blood-pressure reading, without diagnosis of hypertension: Secondary | ICD-10-CM | POA: Diagnosis present

## 2018-02-19 DIAGNOSIS — B009 Herpesviral infection, unspecified: Secondary | ICD-10-CM

## 2018-02-19 LAB — COMPREHENSIVE METABOLIC PANEL
ALK PHOS: 121 U/L (ref 38–126)
ALT: 16 U/L (ref 14–54)
AST: 19 U/L (ref 15–41)
Albumin: 2.7 g/dL — ABNORMAL LOW (ref 3.5–5.0)
Anion gap: 10 (ref 5–15)
BILIRUBIN TOTAL: 0.7 mg/dL (ref 0.3–1.2)
BUN: 12 mg/dL (ref 6–20)
CALCIUM: 8.2 mg/dL — AB (ref 8.9–10.3)
CHLORIDE: 103 mmol/L (ref 101–111)
CO2: 17 mmol/L — ABNORMAL LOW (ref 22–32)
CREATININE: 0.64 mg/dL (ref 0.44–1.00)
GFR calc Af Amer: >60 mL/min (ref >60)
Glucose, Bld: 138 mg/dL — ABNORMAL HIGH (ref 65–99)
Potassium: 3.8 mmol/L (ref 3.5–5.1)
Sodium: 130 mmol/L — ABNORMAL LOW (ref 135–145)
TOTAL PROTEIN: 5.5 g/dL — AB (ref 6.5–8.1)

## 2018-02-19 LAB — GLUCOSE TOLERANCE, 1 HOUR
Glucose, 1 Hour GTT: 135 mg/dL (ref 70–140)
Glucose, 1 Hour GTT: 121 mg/dL (ref 70–140)

## 2018-02-19 LAB — PROTEIN / CREATININE RATIO, URINE
CREATININE, URINE: 126 mg/dL
PROTEIN CREATININE RATIO: 3.31 mg/mg{creat} — AB (ref 0.00–0.15)
TOTAL PROTEIN, URINE: 417 mg/dL

## 2018-02-19 LAB — TYPE AND SCREEN
ABO/RH(D): A POS
ANTIBODY SCREEN: NEGATIVE

## 2018-02-19 LAB — CBC
HEMATOCRIT: 33.2 % — AB (ref 36.0–46.0)
Hemoglobin: 11.2 g/dL — ABNORMAL LOW (ref 12.0–15.0)
MCH: 28.7 pg (ref 26.0–34.0)
MCHC: 33.7 g/dL (ref 30.0–36.0)
MCV: 85.1 fL (ref 78.0–100.0)
PLATELETS: 171 10*3/uL (ref 150–400)
RBC: 3.9 MIL/uL (ref 3.87–5.11)
RDW: 14 % (ref 11.5–15.5)
WBC: 11.3 10*3/uL — AB (ref 4.0–10.5)

## 2018-02-19 LAB — GLUCOSE, RANDOM: Glucose, Bld: 74 mg/dL (ref 65–99)

## 2018-02-19 LAB — POCT PREGNANCY, URINE: PREG TEST UR: POSITIVE — AB

## 2018-02-19 MED ORDER — ZOLPIDEM TARTRATE 5 MG PO TABS: 5.0000 mg | ORAL_TABLET | Freq: Every evening | ORAL | Status: DC | PRN

## 2018-02-19 MED ORDER — ACETAMINOPHEN 325 MG PO TABS
650.0000 mg | ORAL_TABLET | ORAL | Status: DC | PRN
  Administered 2018-02-19 – 2018-02-26 (×5): 650 mg via ORAL
  Filled 2018-02-19 (×5): qty 2

## 2018-02-19 MED ORDER — OXYCODONE-ACETAMINOPHEN 5-325 MG PO TABS
2.0000 | ORAL_TABLET | Freq: Once | ORAL | Status: AC
  Administered 2018-02-19: 2 via ORAL
  Filled 2018-02-19: qty 2

## 2018-02-19 MED ORDER — HYDRALAZINE HCL 20 MG/ML IJ SOLN: 10.0000 mg | Freq: Once | INTRAMUSCULAR | Status: DC | PRN

## 2018-02-19 MED ORDER — CALCIUM CARBONATE ANTACID 500 MG PO CHEW
2.0000 | CHEWABLE_TABLET | ORAL | Status: DC | PRN
  Administered 2018-02-21: 400 mg via ORAL
  Filled 2018-02-19: qty 2

## 2018-02-19 MED ORDER — BETAMETHASONE SOD PHOS & ACET 6 (3-3) MG/ML IJ SUSP
12.0000 mg | Freq: Once | INTRAMUSCULAR | Status: DC
  Filled 2018-02-19: qty 2

## 2018-02-19 MED ORDER — DOCUSATE SODIUM 100 MG PO CAPS
100.0000 mg | ORAL_CAPSULE | Freq: Every day | ORAL | Status: DC
  Administered 2018-02-19 – 2018-03-01 (×9): 100 mg via ORAL
  Filled 2018-02-19 (×9): qty 1

## 2018-02-19 MED ORDER — LABETALOL HCL 5 MG/ML IV SOLN
20.0000 mg | INTRAVENOUS | Status: DC | PRN
  Administered 2018-02-26: 20 mg via INTRAVENOUS
  Filled 2018-02-19 (×2): qty 4

## 2018-02-19 MED ORDER — BETAMETHASONE SOD PHOS & ACET 6 (3-3) MG/ML IJ SUSP
12.0000 mg | INTRAMUSCULAR | Status: AC
  Administered 2018-02-19 – 2018-02-20 (×2): 12 mg via INTRAMUSCULAR
  Filled 2018-02-19 (×2): qty 2

## 2018-02-19 MED ORDER — PRENATAL MULTIVITAMIN CH
1.0000 | ORAL_TABLET | Freq: Every day | ORAL | Status: DC
  Administered 2018-02-19 – 2018-03-01 (×10): 1 via ORAL
  Filled 2018-02-19 (×10): qty 1

## 2018-02-19 NOTE — H&P (Signed)
History and Physical Note      Chief Complaint  Patient presents with  . Hypertension    HPI   Ms.  Zachary Georgemanda B Grabski is a 37 y.o. year old 703P1101 female at 2253w5d weeks gestation who was sent to MAU for elevated BPs, completion of 2 hr GTT and BPP. She also has had a weight gain of 7 lbs since last visit on 3/7, swelling in face, hands, legs ankles, and feet. She reports having a H/A that won't go away and visual "floaters" while in clinic this morning. She has a h/o of IUFD at 26 wks.     Past Medical History:  Diagnosis Date  . Chlamydia    . Chronic interstitial cystitis 02/25/2013  . Dysplasia of cervix, low grade (CIN 1)    . HPV (human papilloma virus) infection    . HSV-2 (herpes simplex virus 2) infection    . Interstitial cystitis    . OAB (overactive bladder) 09/24/2013  . Stress at home 09/24/2013  . Superficial thrombosis of left lower extremity 09/2017           Past Surgical History:  Procedure Laterality Date  . CESAREAN SECTION      . tubes in ears               Family History  Problem Relation Age of Onset  . Interstitial cystitis Maternal Grandmother    . Stroke Maternal Grandmother    . Heart disease Maternal Grandmother    . Cancer Maternal Aunt          breast  . Hypertension Mother    . Asthma Mother    . Asthma Brother    . Asthma Daughter    . Hypertension Maternal Grandfather        Social History         Tobacco Use  . Smoking status: Never Smoker  . Smokeless tobacco: Never Used  Substance Use Topics  . Alcohol use: No      Comment: occ wine; not now  . Drug use: No      Allergies: No Known Allergies          Medications Prior to Admission  Medication Sig Dispense Refill Last Dose  . acetaminophen (TYLENOL) 500 MG tablet Take 1,000 mg by mouth every 6 (six) hours as needed for moderate pain or headache.     02/18/2018 at Unknown time  . aspirin EC 81 MG tablet Take 1 tablet (81 mg total) by mouth daily. Take after 12 weeks for  prevention of preeclampsia later in pregnancy 300 tablet 2 02/18/2018 at Unknown time  . FOLIC ACID PO Take 400 mcg by mouth daily.      02/18/2018 at Unknown time  . Prenatal MV-Min-FA-Omega-3 (PRENATAL GUMMIES/DHA & FA) 0.4-32.5 MG CHEW Chew 2 each by mouth daily.     02/18/2018 at Unknown time      Review of Systems  Constitutional: Negative.   HENT: Negative.   Eyes: Positive for visual disturbance ("seeing floaters").  Respiratory: Negative.   Cardiovascular: Positive for leg swelling ("swelling in hands and face as well").  Gastrointestinal: Negative.   Endocrine: Negative.   Genitourinary: Negative.   Skin: Negative.   Allergic/Immunologic: Negative.   Neurological: Positive for headaches ("won't go away").  Hematological: Negative.   Psychiatric/Behavioral: Negative.     Physical Exam    Patient Vitals for the past 24 hrs:   BP Temp Temp src Pulse Resp Height Weight  02/19/18 1332 132/74 - - 78 - - -  02/19/18 1316 (!) 153/91 - - 82 - - -  02/19/18 1306 (!) 144/89 - - 84 - - -  02/19/18 1201 (!) 141/86 - - 94 - - -  02/19/18 1131 (!) 147/88 - - 77 - - -  02/19/18 1103 (!) 144/100 - - 81 - - -  02/19/18 1046 (!) 141/101 - - 79 - - -  02/19/18 1032 (!) 159/100 - - 87 - - -  02/19/18 1016 (!) 142/98 - - 85 - - -  02/19/18 1001 (!) 165/119 - - 80 - - -  02/19/18 0946 (!) 155/99 98.2 F (36.8 C) Oral 84 18 - -  02/19/18 0926 (!) 144/99 98 F (36.7 C) - 81 18 5' (1.524 m) 244 lb (110.7 kg)      Physical Exam  Nursing note and vitals reviewed. Constitutional: She is oriented to person, place, and time. She appears well-developed and well-nourished.  HENT:  Head: Normocephalic and atraumatic.  Eyes: Pupils are equal, round, and reactive to light.  Neck: Normal range of motion.  Cardiovascular: Normal rate, regular rhythm, normal heart sounds and intact distal pulses.  Respiratory: Effort normal and breath sounds normal.  GI: Soft. Bowel sounds are normal.   Genitourinary:  Genitourinary Comments: deferred  Musculoskeletal: Normal range of motion. She exhibits edema (1+ pitting edema).  Neurological: She is alert and oriented to person, place, and time. She has normal reflexes.  Skin: Skin is warm and dry.  Psychiatric: She has a normal mood and affect. Her behavior is normal. Judgment and thought content normal.      MAU Course  Procedures   MDM PEC labs 2 hr GTT completion BPP: 6/8 / AFI WNL BMZ 12 mg IM GBS -- pending NST - FHR: 145 bpm / moderate variability / accels present / decels absent / TOCO: none // reassessment at 1325: in to talk with patient when FHR was noted to be in a prolonged decel down to 55-60 bpm x 4 mins   *Consult with Dr. Macon Large @ 1320 - notified of patient's complaints, assessments, lab & U/S results, recommended tx plan admit to antenatal unit, BMZ, CEFM, GBS, Percocet for H/A, rpt BPP in AM   Lab Results Last 24 Hours        Results for orders placed or performed during the hospital encounter of 02/19/18 (from the past 24 hour(s))  Glucose, random     Status: None    Collection Time: 02/19/18  8:21 AM  Result Value Ref Range    Glucose, Bld 74 65 - 99 mg/dL  Protein / creatinine ratio, urine     Status: Abnormal    Collection Time: 02/19/18  9:25 AM  Result Value Ref Range    Creatinine, Urine 126.00 mg/dL    Total Protein, Urine 417 mg/dL    Protein Creatinine Ratio 3.31 (H) 0.00 - 0.15 mg/mg[Cre]  CBC     Status: Abnormal    Collection Time: 02/19/18  9:45 AM  Result Value Ref Range    WBC 11.3 (H) 4.0 - 10.5 K/uL    RBC 3.90 3.87 - 5.11 MIL/uL    Hemoglobin 11.2 (L) 12.0 - 15.0 g/dL    HCT 96.0 (L) 45.4 - 46.0 %    MCV 85.1 78.0 - 100.0 fL    MCH 28.7 26.0 - 34.0 pg    MCHC 33.7 30.0 - 36.0 g/dL    RDW 09.8 11.9 - 14.7 %  Platelets 171 150 - 400 K/uL  Comprehensive metabolic panel     Status: Abnormal    Collection Time: 02/19/18  9:45 AM  Result Value Ref Range    Sodium 130 (L) 135 -  145 mmol/L    Potassium 3.8 3.5 - 5.1 mmol/L    Chloride 103 101 - 111 mmol/L    CO2 17 (L) 22 - 32 mmol/L    Glucose, Bld 138 (H) 65 - 99 mg/dL    BUN 12 6 - 20 mg/dL    Creatinine, Ser 1.61 0.44 - 1.00 mg/dL    Calcium 8.2 (L) 8.9 - 10.3 mg/dL    Total Protein 5.5 (L) 6.5 - 8.1 g/dL    Albumin 2.7 (L) 3.5 - 5.0 g/dL    AST 19 15 - 41 U/L    ALT 16 14 - 54 U/L    Alkaline Phosphatase 121 38 - 126 U/L    Total Bilirubin 0.7 0.3 - 1.2 mg/dL    GFR calc non Af Amer >60 >60 mL/min    GFR calc Af Amer >60 >60 mL/min    Anion gap 10 5 - 15  Glucose tolerance, 1 hour     Status: None    Collection Time: 02/19/18  9:45 AM  Result Value Ref Range    Glucose, 2 Hour GTT 135 70 - 140 mg/dL  Type and screen [facility]      Status: None (Preliminary result)    Collection Time: 02/19/18  9:45 AM  Result Value Ref Range    ABO/RH(D) A POS      Antibody Screen PENDING      Sample Expiration          02/22/2018 Performed at [facility], [address], [place], [place] [address]    Pregnancy, urine POC     Status: Abnormal    Collection Time: 02/19/18 10:27 AM  Result Value Ref Range    Preg Test, Ur POSITIVE (A) NEGATIVE  Glucose tolerance, 1 hour     Status: None    Collection Time: 02/19/18 10:28 AM  Result Value Ref Range    Glucose, 1 Hour GTT 121 70 - 140 mg/dL        Assessment and Plan  Antepartum preeclampsia, third trimester  - Admit to Antenatal Unit [facility] MDs assumes care of patient upon admission    Raelyn Mora, MSN, CNM 02/19/2018, 10:12 AM     Attestation of Attending Supervision of Advanced Practice Provider (PA/CNM/NP): Evaluation and management procedures were performed by the Advanced Practice Provider under my supervision and collaboration.  I have reviewed the Advanced Practice Provider's note and chart, and I agree with the management and plan.  Will continue to monitor BP and symptoms closely for possible severe preeclampsia and  start magnesium sulfate for eclampsia prophylaxis as needed Will continue to watch FHR closely, repeat BPP in am and complete betamethasone regimen Patient had 2 hr GTT today in the office, I feel it was recorded erroneously and her values were [phone]; will verify with office Routine antenatal care   Jaynie Collins, MD, FACOG Obstetrician & Gynecologist, [facility] for [employer], [facility]

## 2018-02-19 NOTE — Progress Notes (Signed)
   PRENATAL VISIT NOTE  Subjective:  Sheila Cline is a 37 y.o. G3P1101 at 6570w5d being seen today for ongoing prenatal care.  She is currently monitored for the following issues for this high-risk pregnancy and has HSV-2 (herpes simplex virus 2) infection; Supervision of high risk pregnancy, antepartum; History of placenta abruption leading to loss at 26 weeks; Pregnancy complicated by obesity; LGSIL on Pap smear of cervix; Superficial thrombosis of left lower extremity; Exposure to the flu; and Antepartum mild preeclampsia, third trimester on their problem list.   Patient reports new onset significant swelling in feet yesterday, had headache all day yesterday that did not remit with tylenol and only improved after she went to sleep, noted floaters in vision this am that are ongoing. Also reports significant weight gain in last few weeks. Contractions: Not present. Vag. Bleeding: None.  Movement: Present. Denies leaking of fluid.   The following portions of the patient's history were reviewed and updated as appropriate: allergies, current medications, past family history, past medical history, past social history, past surgical history and problem list. Problem list updated.   Objective:   Vitals:   02/19/18 0831 02/19/18 0835  BP: (!) 140/93 (!) 156/94  Pulse: 68 72  Weight: 243 lb 1.6 oz (110.3 kg)      Fetal Status: Fetal Heart Rate (bpm): 165   Movement: Present     General:  Alert, oriented and cooperative. Patient is in no acute distress.  Skin: Skin is warm and dry. No rash noted.   Cardiovascular: Normal heart rate noted  Respiratory: Normal respiratory effort, no problems with respiration noted  Abdomen: Soft, gravid, appropriate for gestational age.  Pain/Pressure: Present     Pelvic: Cervical exam deferred        Extremities: Normal range of motion.  Edema: Mild pitting, slight indentation  Mental Status:  Normal mood and affect. Normal behavior. Normal judgment and thought  content.   Assessment and Plan:  Pregnancy: G3P1101 at 6870w5d  1. Antepartum mild preeclampsia, third trimester With floaters and increased BP today, will go to MAU for rule out PEC Reviewed s/s with patient, she is agreeable  2. Superficial thrombosis of left lower extremity  3. HSV-2 (herpes simplex virus 2) infection ppx at 36 weeks  4. Supervision of high risk pregnancy, antepartum  5. History of placenta abruption leading to loss at 26 weeks Weekly NST/BPP  6. LGSIL on Pap smear of cervix Pap pp  To MAU for rule out PEC  Preterm labor symptoms and general obstetric precautions including but not limited to vaginal bleeding, contractions, leaking of fluid and fetal movement were reviewed in detail with the patient. Please refer to After Visit Summary for other counseling recommendations.  Return in about 1 week (around 02/26/2018) for OB visit (MD).   Conan BowensKelly M Davis, MD

## 2018-02-19 NOTE — MAU Note (Signed)
Pt sent from office for elevated b/P.Reports seeing spots.Mild headache.

## 2018-02-19 NOTE — MAU Provider Note (Signed)
History     CSN: [id number]  Arrival date and time: 02/19/18 0910   First Provider Initiated Contact with Patient 02/19/18 [phone]      Chief Complaint  Patient presents with  . Hypertension   HPI  Ms.  Sheila Cline is a 37 y.o. year old G82P1101 female at [redacted]w[redacted]d weeks gestation who was sent to MAU for elevated BPs, completion of 2 hr GTT and BPP. She also has had a weight gain of 7 lbs since last visit on 3/7, swelling in face, hands, legs ankles, and feet. She reports having a H/A that won't go away and visual "floaters" while in clinic this morning. She has a h/o of IUFD at 26 wks.    Past Medical History:  Diagnosis Date  . Chlamydia   . Chronic interstitial cystitis 02/25/2013  . Dysplasia of cervix, low grade (CIN 1)   . HPV (human papilloma virus) infection   . HSV-2 (herpes simplex virus 2) infection   . Interstitial cystitis   . OAB (overactive bladder) 09/24/2013  . Stress at home 09/24/2013  . Superficial thrombosis of left lower extremity 09/2017    Past Surgical History:  Procedure Laterality Date  . CESAREAN SECTION    . tubes in ears      Family History  Problem Relation Age of Onset  . Interstitial cystitis Maternal Grandmother   . Stroke Maternal Grandmother   . Heart disease Maternal Grandmother   . Cancer Maternal Aunt        breast  . Hypertension Mother   . Asthma Mother   . Asthma Brother   . Asthma Daughter   . Hypertension Maternal Grandfather     Social History   Tobacco Use  . Smoking status: Never Smoker  . Smokeless tobacco: Never Used  Substance Use Topics  . Alcohol use: No    Comment: occ wine; not now  . Drug use: No    Allergies: No Known Allergies  Medications Prior to Admission  Medication Sig Dispense Refill Last Dose  . acetaminophen (TYLENOL) 500 MG tablet Take 1,000 mg by mouth every 6 (six) hours as needed for moderate pain or headache.   02/18/2018 at Unknown time  . aspirin EC 81 MG tablet Take 1 tablet (81 mg  total) by mouth daily. Take after 12 weeks for prevention of preeclampsia later in pregnancy 300 tablet 2 02/18/2018 at Unknown time  . FOLIC ACID PO Take 400 mcg by mouth daily.    02/18/2018 at Unknown time  . Prenatal MV-Min-FA-Omega-3 (PRENATAL GUMMIES/DHA & FA) 0.4-32.5 MG CHEW Chew 2 each by mouth daily.   02/18/2018 at Unknown time    Review of Systems  Constitutional: Negative.   HENT: Negative.   Eyes: Positive for visual disturbance ("seeing floaters").  Respiratory: Negative.   Cardiovascular: Positive for leg swelling ("swelling in hands and face as well").  Gastrointestinal: Negative.   Endocrine: Negative.   Genitourinary: Negative.   Skin: Negative.   Allergic/Immunologic: Negative.   Neurological: Positive for headaches ("won't go away").  Hematological: Negative.   Psychiatric/Behavioral: Negative.    Physical Exam   Patient Vitals for the past 24 hrs:  BP Temp Temp src Pulse Resp Height Weight  02/19/18 1332 132/74 - - 78 - - -  02/19/18 1316 (!) 153/91 - - 82 - - -  02/19/18 1306 (!) 144/89 - - 84 - - -  02/19/18 1201 (!) 141/86 - - 94 - - -  02/19/18 1131 (!) 147/88 - -  77 - - -  02/19/18 1103 (!) 144/100 - - 81 - - -  02/19/18 1046 (!) 141/101 - - 79 - - -  02/19/18 1032 (!) 159/100 - - 87 - - -  02/19/18 1016 (!) 142/98 - - 85 - - -  02/19/18 1001 (!) 165/119 - - 80 - - -  02/19/18 0946 (!) 155/99 98.2 F (36.8 C) Oral 84 18 - -  02/19/18 0926 (!) 144/99 98 F (36.7 C) - 81 18 5' (1.524 m) 244 lb (110.7 kg)    Physical Exam  Nursing note and vitals reviewed. Constitutional: She is oriented to person, place, and time. She appears well-developed and well-nourished.  HENT:  Head: Normocephalic and atraumatic.  Eyes: Pupils are equal, round, and reactive to light.  Neck: Normal range of motion.  Cardiovascular: Normal rate, regular rhythm, normal heart sounds and intact distal pulses.  Respiratory: Effort normal and breath sounds normal.  GI: Soft.  Bowel sounds are normal.  Genitourinary:  Genitourinary Comments: deferred  Musculoskeletal: Normal range of motion. She exhibits edema (1+ pitting edema).  Neurological: She is alert and oriented to person, place, and time. She has normal reflexes.  Skin: Skin is warm and dry.  Psychiatric: She has a normal mood and affect. Her behavior is normal. Judgment and thought content normal.    MAU Course  Procedures  MDM PEC labs 2 hr GTT completion BPP: 6/8 / AFI WNL BMZ 12 mg IM GBS -- pending NST - FHR: 145 bpm / moderate variability / accels present / decels absent / TOCO: none // reassessment at 1325: in to talk with patient when FHR was noted to be in a prolonged decel down to 55-60 bpm x 4 mins  *Consult with Dr. Macon LargeAnyanwu @ 1320 - notified of patient's complaints, assessments, lab & U/S results, recommended tx plan admit to antenatal unit, BMZ, CEFM, GBS, Percocet for H/A, rpt BPP in AM  Results for orders placed or performed during the hospital encounter of 02/19/18 (from the past 24 hour(s))  Glucose, random     Status: None   Collection Time: 02/19/18  8:21 AM  Result Value Ref Range   Glucose, Bld 74 65 - 99 mg/dL  Protein / creatinine ratio, urine     Status: Abnormal   Collection Time: 02/19/18  9:25 AM  Result Value Ref Range   Creatinine, Urine 126.00 mg/dL   Total Protein, Urine 417 mg/dL   Protein Creatinine Ratio 3.31 (H) 0.00 - 0.15 mg/mg[Cre]  CBC     Status: Abnormal   Collection Time: 02/19/18  9:45 AM  Result Value Ref Range   WBC 11.3 (H) 4.0 - 10.5 K/uL   RBC 3.90 3.87 - 5.11 MIL/uL   Hemoglobin 11.2 (L) 12.0 - 15.0 g/dL   HCT 69.633.2 (L) 29.536.0 - 28.446.0 %   MCV 85.1 78.0 - 100.0 fL   MCH 28.7 26.0 - 34.0 pg   MCHC 33.7 30.0 - 36.0 g/dL   RDW 13.214.0 44.011.5 - 10.215.5 %   Platelets 171 150 - 400 K/uL  Comprehensive metabolic panel     Status: Abnormal   Collection Time: 02/19/18  9:45 AM  Result Value Ref Range   Sodium 130 (L) 135 - 145 mmol/L   Potassium 3.8 3.5 -  5.1 mmol/L   Chloride 103 101 - 111 mmol/L   CO2 17 (L) 22 - 32 mmol/L   Glucose, Bld 138 (H) 65 - 99 mg/dL   BUN 12 6 -  20 mg/dL   Creatinine, Ser 1.61 0.44 - 1.00 mg/dL   Calcium 8.2 (L) 8.9 - 10.3 mg/dL   Total Protein 5.5 (L) 6.5 - 8.1 g/dL   Albumin 2.7 (L) 3.5 - 5.0 g/dL   AST 19 15 - 41 U/L   ALT 16 14 - 54 U/L   Alkaline Phosphatase 121 38 - 126 U/L   Total Bilirubin 0.7 0.3 - 1.2 mg/dL   GFR calc non Af Amer >60 >60 mL/min   GFR calc Af Amer >60 >60 mL/min   Anion gap 10 5 - 15  Glucose tolerance, 1 hour     Status: None   Collection Time: 02/19/18  9:45 AM  Result Value Ref Range   Glucose, 1 Hour GTT 135 70 - 140 mg/dL  Type and screen [facility] [place]     Status: None (Preliminary result)   Collection Time: 02/19/18  9:45 AM  Result Value Ref Range   ABO/RH(D) A POS    Antibody Screen PENDING    Sample Expiration      02/22/2018 Performed at [facility], [address], [place], [place] [address]   Pregnancy, urine POC     Status: Abnormal   Collection Time: 02/19/18 10:27 AM  Result Value Ref Range   Preg Test, Ur POSITIVE (A) NEGATIVE  Glucose tolerance, 1 hour     Status: None   Collection Time: 02/19/18 10:28 AM  Result Value Ref Range   Glucose, 1 Hour GTT 121 70 - 140 mg/dL     Assessment and Plan  Antepartum mild preeclampsia, third trimester  - Admit to Antenatal Unit - Routine Antenatal Orders - [facility] MDs assumes care of patient upon admission   Raelyn Mora, MSN, CNM 02/19/2018, 10:12 AM

## 2018-02-19 NOTE — Progress Notes (Signed)
Scheduled testing did not occur. Pt sent to MAU for r/o Pre-eclampsia following visit w/Dr. Earlene Plateravis. .Marland Kitchen

## 2018-02-20 ENCOUNTER — Inpatient Hospital Stay (HOSPITAL_COMMUNITY): Payer: Medicaid Other

## 2018-02-20 NOTE — Progress Notes (Signed)
Patient ID: Sheila Cline, female   DOB: [birth year], 37 y.o.   MRN: [id number] FACULTY PRACTICE ANTEPARTUM(COMPREHENSIVE) NOTE  Sheila Cline is a 37 y.o. G3P1101 at 620w6d  who is admitted for preeclampsia.    Fetal presentation is cephalic. Length of Stay:  1  Days  Date of admission:02/19/2018  Subjective: Patient reports feeling better. She reports a mild headache this morning which she expect to resolve following her morning coffee. She denies any visual changes and RUQ/epigastric pain. Patient reports the fetal movement as active. Patient reports uterine contraction  activity as none. Patient reports  vaginal bleeding as none. Patient describes fluid per vagina as None.  Vitals:  Blood pressure (!) 156/92, pulse 80, temperature 99 F (37.2 C), temperature source Oral, resp. rate 18, height 5' (1.524 m), weight 244 lb (110.7 kg), last menstrual period 07/26/2017, SpO2 98 %, unknown if currently breastfeeding. Vitals:   02/20/18 0004 02/20/18 0049 02/20/18 0440 02/20/18 0700  BP: (!) 160/83 (!) 147/91 (!) 151/93 (!) 156/92  Pulse: 79 79 70 80  Resp: 18 18 16 18   Temp: 98.2 F (36.8 C)  97.8 F (36.6 C) 99 F (37.2 C)  TempSrc: Oral  Oral Oral  SpO2: 96% 96% 97% 98%  Weight:      Height:       Physical Examination:  General appearance - alert, well appearing, and in no distress Fundal Height:  size equals dates Pelvic Exam:  examination not indicated Cervical Exam: Not evaluated.  Extremities: extremities normal, atraumatic, no cyanosis or edema with DTRs 2+ bilaterally Membranes:intact  Fetal Monitoring:  Baseline: 130 bpm, Variability: Good {> 6 bpm), Accelerations: Reactive and Decelerations: Absent   Reactive this am  Labs:  Results for orders placed or performed during the hospital encounter of 02/19/18 (from the past 24 hour(s))  Pregnancy, urine POC   Collection Time: 02/19/18 10:27 AM  Result Value Ref Range   Preg Test, Ur POSITIVE (A) NEGATIVE  Glucose  tolerance, 1 hour   Collection Time: 02/19/18 10:28 AM  Result Value Ref Range   Glucose, 1 Hour GTT 121 70 - 140 mg/dL    Imaging Studies:     3/19 BPP (prelim) 6/8 (no breathing)  Medications:  Scheduled . betamethasone acetate-betamethasone sodium phosphate  12 mg Intramuscular Q24 Hr x 2  . docusate sodium  100 mg Oral Daily  . prenatal multivitamin  1 tablet Oral Q1200   I have reviewed the patient's current medications.  ASSESSMENT: [id number] 7120w6d Estimated Date of Delivery: 05/02/18  Patient Active Problem List   Diagnosis Date Noted  . Abnormal antenatal test 02/19/2018  . Preeclampsia, third trimester 02/15/2018  . Exposure to the flu 01/22/2018  . LGSIL on Pap smear of cervix 10/09/2017  . Superficial thrombosis of left lower extremity 10/09/2017  . Supervision of high risk pregnancy, antepartum 09/25/2017  . History of placenta abruption leading to loss at 26 weeks 09/25/2017  . Pregnancy complicated by obesity 09/25/2017  . HSV-2 (herpes simplex virus 2) infection 03/25/2013    PLAN: 37 yo G3P1101 at 2120w6d with preeclampsia - BP stable without medication  - Reassuring fetal status this morning - patient to receive second dose of BMZ today - Normal 2 hour yesterday (initiated in office followed by 2 blood draws an hour apart in MAU) - Continue close antenatal observation  Shreyansh Tiffany 02/20/2018,9:54 AM

## 2018-02-21 LAB — CULTURE, BETA STREP (GROUP B ONLY)

## 2018-02-21 LAB — OB RESULTS CONSOLE GBS: GBS: NEGATIVE

## 2018-02-21 NOTE — Progress Notes (Signed)
Patient ID: Sheila Cline, female   DOB: 08/03/1981, 37 y.o.   MRN: [id number] FACULTY PRACTICE ANTEPARTUM(COMPREHENSIVE) NOTE  Sheila Cline is a 37 y.o. G3P1101 at 1457w0d  who is admitted for preeclampsia.    Fetal presentation is cephalic. Length of Stay:  2  Days  Date of admission:02/19/2018  Subjective: Patient reports well and is without complaints. She denies any headaches, visual changes and RUQ/epigastric pain. She admits to being anxious as the one year anniversary of her IUFD is on approaching on 3/23. Patient reports the fetal movement as active. Patient reports uterine contraction  activity as none. Patient reports  vaginal bleeding as none. Patient describes fluid per vagina as None.  Vitals:  Blood pressure (!) 119/57, pulse 66, temperature 99.2 F (37.3 C), temperature source Oral, resp. rate 20, height 5' (1.524 m), weight 244 lb (110.7 kg), last menstrual period 07/26/2017, SpO2 97 %, unknown if currently breastfeeding. Vitals:   02/21/18 0300 02/21/18 0400 02/21/18 0820 02/21/18 1200  BP:   129/79 (!) 119/57  Pulse:   89 66  Resp:   20 20  Temp:   98.2 F (36.8 C) 99.2 F (37.3 C)  TempSrc:   Oral Oral  SpO2: 100% 98% 100% 97%  Weight:      Height:       Physical Examination:  General appearance - alert, well appearing, and in no distress Fundal Height:  size equals dates Pelvic Exam:  examination not indicated Cervical Exam: Not evaluated.  Extremities: extremities normal, atraumatic, no cyanosis or edema with DTRs 2+ bilaterally Membranes:intact  Fetal Monitoring:  Baseline: 135 bpm, Variability: Good {> 6 bpm), Accelerations: 10x 10 accels and Decelerations: Variable: prolonged decel with spontaneous return to baseline   Reactive this am  Labs:  No results found for this or any previous visit (from the past 24 hour(s)).  Imaging Studies:     3/19 BPP (prelim) 6/8 (no breathing)  Medications:  Scheduled . docusate sodium  100 mg Oral Daily  .  prenatal multivitamin  1 tablet Oral Q1200   I have reviewed the patient's current medications.  ASSESSMENT: [id number] 6263w6d Estimated Date of Delivery: 05/02/18  Patient Active Problem List   Diagnosis Date Noted  . Abnormal antenatal test 02/19/2018  . Preeclampsia, third trimester 02/15/2018  . Exposure to the flu 01/22/2018  . LGSIL on Pap smear of cervix 10/09/2017  . Superficial thrombosis of left lower extremity 10/09/2017  . Supervision of high risk pregnancy, antepartum 09/25/2017  . History of placenta abruption leading to loss at 26 weeks 09/25/2017  . Pregnancy complicated by obesity 09/25/2017  . 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

## 2018-02-22 LAB — CBC
HEMATOCRIT: 32.6 % — AB (ref 36.0–46.0)
HEMOGLOBIN: 10.8 g/dL — AB (ref 12.0–15.0)
MCH: 28.8 pg (ref 26.0–34.0)
MCHC: 33.1 g/dL (ref 30.0–36.0)
MCV: 86.9 fL (ref 78.0–100.0)
Platelets: 181 10*3/uL (ref 150–400)
RBC: 3.75 MIL/uL — AB (ref 3.87–5.11)
RDW: 14.5 % (ref 11.5–15.5)
WBC: 15.8 10*3/uL — AB (ref 4.0–10.5)

## 2018-02-22 LAB — COMPREHENSIVE METABOLIC PANEL
ALK PHOS: 108 U/L (ref 38–126)
ALT: 19 U/L (ref 14–54)
AST: 22 U/L (ref 15–41)
Albumin: 2.5 g/dL — ABNORMAL LOW (ref 3.5–5.0)
Anion gap: 8 (ref 5–15)
BILIRUBIN TOTAL: 0.5 mg/dL (ref 0.3–1.2)
BUN: 24 mg/dL — AB (ref 6–20)
CALCIUM: 9.1 mg/dL (ref 8.9–10.3)
CO2: 21 mmol/L — AB (ref 22–32)
Chloride: 107 mmol/L (ref 101–111)
Creatinine, Ser: 0.67 mg/dL (ref 0.44–1.00)
GFR calc Af Amer: >60 mL/min (ref >60)
GFR calc non Af Amer: >60 mL/min (ref >60)
GLUCOSE: 83 mg/dL (ref 65–99)
Potassium: 4.5 mmol/L (ref 3.5–5.1)
SODIUM: 136 mmol/L (ref 135–145)
Total Protein: 5 g/dL — ABNORMAL LOW (ref 6.5–8.1)

## 2018-02-22 LAB — TYPE AND SCREEN
ABO/RH(D): A POS
Antibody Screen: NEGATIVE

## 2018-02-22 MED ORDER — LACTATED RINGERS IV SOLN
INTRAVENOUS | Status: DC
  Administered 2018-02-22 – 2018-02-26 (×3): via INTRAVENOUS

## 2018-02-22 MED ORDER — SODIUM CHLORIDE 0.9% FLUSH
3.0000 mL | Freq: Two times a day (BID) | INTRAVENOUS | Status: DC
  Administered 2018-02-22 – 2018-03-01 (×12): 3 mL via INTRAVENOUS

## 2018-02-22 MED ORDER — SODIUM CHLORIDE 0.9% FLUSH: 3.0000 mL | INTRAVENOUS | Status: DC | PRN

## 2018-02-22 NOTE — Progress Notes (Signed)
Patient ID: Sheila Cline, female   DOB: 22-Jan-1981, 37 y.o.   MRN: [id number] FACULTY PRACTICE ANTEPARTUM(COMPREHENSIVE) NOTE  Sheila Cline is a 37 y.o. G3P1101 at [redacted]w[redacted]d  who is admitted for preeclampsia.    Fetal presentation is cephalic. Length of Stay:  3  Days  Date of admission:02/19/2018  Subjective: Patient reports well and is without complaints. She denies any headaches, visual changes and RUQ/epigastric pain.  Patient reports the fetal movement as active. Patient reports uterine contraction  activity as none. Patient reports  vaginal bleeding as none. Patient describes fluid per vagina as None.  Vitals:  Blood pressure (!) 149/97, pulse (!) 57, temperature 99.8 F (37.7 C), temperature source Oral, resp. rate 18, height 5' (1.524 m), weight 244 lb (110.7 kg), last menstrual period 07/26/2017, SpO2 96 %, unknown if currently breastfeeding. Vitals:   02/21/18 2048 02/22/18 0029 02/22/18 0454 02/22/18 0809  BP: (!) 113/56 134/70 128/80 (!) 149/97  Pulse: 76 64 (!) 55 (!) 57  Resp: 19 19 19 18   Temp: 98 F (36.7 C) 98 F (36.7 C) 97.9 F (36.6 C) 99.8 F (37.7 C)  TempSrc: Oral Oral Oral Oral  SpO2: 94% 98% 97% 96%  Weight:      Height:       Physical Examination:  General appearance - alert, well appearing, and in no distress Fundal Height:  size equals dates Pelvic Exam:  examination not indicated Cervical Exam: Not evaluated.  Extremities: extremities normal, atraumatic, no cyanosis or edema with DTRs 2+ bilaterally Membranes:intact  Fetal Monitoring:  Baseline: 140 bpm, Variability: Good {> 6 bpm), Accelerations: 10 x 10 and Decelerations: Variable: occasional varable decels overnight   Reactive this am  Labs:  Results for orders placed or performed during the hospital encounter of 02/19/18 (from the past 24 hour(s))  CBC   Collection Time: 02/22/18  6:35 AM  Result Value Ref Range   WBC 15.8 (H) 4.0 - 10.5 K/uL   RBC 3.75 (L) 3.87 - 5.11 MIL/uL   Hemoglobin  10.8 (L) 12.0 - 15.0 g/dL   HCT 78.2 (L) 95.6 - 21.3 %   MCV 86.9 78.0 - 100.0 fL   MCH 28.8 26.0 - 34.0 pg   MCHC 33.1 30.0 - 36.0 g/dL   RDW 08.6 57.8 - 46.9 %   Platelets 181 150 - 400 K/uL  Comprehensive metabolic panel   Collection Time: 02/22/18  6:35 AM  Result Value Ref Range   Sodium 136 135 - 145 mmol/L   Potassium 4.5 3.5 - 5.1 mmol/L   Chloride 107 101 - 111 mmol/L   CO2 21 (L) 22 - 32 mmol/L   Glucose, Bld 83 65 - 99 mg/dL   BUN 24 (H) 6 - 20 mg/dL   Creatinine, Ser 6.29 0.44 - 1.00 mg/dL   Calcium 9.1 8.9 - 52.8 mg/dL   Total Protein 5.0 (L) 6.5 - 8.1 g/dL   Albumin 2.5 (L) 3.5 - 5.0 g/dL   AST 22 15 - 41 U/L   ALT 19 14 - 54 U/L   Alkaline Phosphatase 108 38 - 126 U/L   Total Bilirubin 0.5 0.3 - 1.2 mg/dL   GFR calc non Af Amer >60 >60 mL/min   GFR calc Af Amer >60 >60 mL/min   Anion gap 8 5 - 15  Type and screen [facility]   Collection Time: 02/22/18  6:35 AM  Result Value Ref Range   ABO/RH(D) A POS    Antibody Screen  PENDING    Sample Expiration      02/25/2018 Performed at [facility], [address] [place], [place] [id number]     Imaging Studies:     Medications:  Scheduled . docusate sodium  100 mg Oral Daily  . prenatal multivitamin  1 tablet Oral Q1200  . sodium chloride flush  3 mL Intravenous Q12H   I have reviewed the patient's current medications.  ASSESSMENT: [id number] 121w6d Estimated Date of Delivery: 05/02/18  Patient Active Problem List   Diagnosis Date Noted  . Abnormal antenatal test 02/19/2018  . Preeclampsia, third trimester 02/15/2018  . Exposure to the flu 01/22/2018  . LGSIL on Pap smear of cervix 10/09/2017  . Superficial thrombosis of left lower extremity 10/09/2017  . Supervision of high risk pregnancy, antepartum 09/25/2017  . History of placenta abruption leading to loss at 26 weeks 09/25/2017  . Pregnancy complicated by obesity 09/25/2017  . 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

## 2018-02-22 NOTE — Plan of Care (Signed)
Education continuing  

## 2018-02-22 NOTE — Progress Notes (Signed)
Patient sitting up in chair 

## 2018-02-23 NOTE — Progress Notes (Signed)
I offered support to pt and husband as the 1 year anniversary of their daughter, Sheila Cline (IUFD at 8028 weeks) is approaching this weekend and she is here in the hospital awaiting the Cline of Sheila Cline.  She is grateful to be here because it puts her mind at ease, but she is also very anxious from all of the stress of Sheila birthday.  She was grateful for the support and she asked me to keep her in my prayers, which I gladly agreed to do.    Chaplain Dyanne CarrelKaty Marvette Schamp, Bcc Pager, [phone] 3:24 PM   02/23/18 1500  Clinical Encounter Type  Visited With Patient and family together  Visit Type Spiritual support  Referral From Nurse

## 2018-02-23 NOTE — Progress Notes (Signed)
Patient ID: Sheila Cline, female   DOB: [birth year], 37 y.o.   MRN: [id number] FACULTY PRACTICE ANTEPARTUM(COMPREHENSIVE) NOTE  Sheila Cline is a 37 y.o. G3P1101 at 4875w2d  who is admitted for preeclampsia.    Fetal presentation is cephalic. Length of Stay:  4  Days  Date of admission:02/19/2018  Subjective: Patient reports well and is without complaints. She denies any headaches, visual changes and RUQ/epigastric pain.  Patient reports the fetal movement as active. Patient reports uterine contraction  activity as none. Patient reports  vaginal bleeding as none. Patient describes fluid per vagina as None.  Vitals:  Blood pressure (!) 145/85, pulse 68, temperature 98 F (36.7 C), temperature source Oral, resp. rate 16, height 5' (1.524 m), weight 244 lb (110.7 kg), last menstrual period 07/26/2017, SpO2 98 %, unknown if currently breastfeeding. Vitals:   02/22/18 2356 02/23/18 0441 02/23/18 0805 02/23/18 1136  BP: 135/83 (!) 144/86 139/83 (!) 145/85  Pulse: (!) 59 (!) 56 72 68  Resp: 18 18 17 16   Temp: 97.9 F (36.6 C) 97.6 F (36.4 C) 98.7 F (37.1 C) 98 F (36.7 C)  TempSrc: Oral Oral  Oral  SpO2: 97% 97% 96% 98%  Weight:      Height:       Physical Examination:  General appearance - alert, well appearing, and in no distress Fundal Height:  size equals dates Pelvic Exam:  examination not indicated Cervical Exam: Not evaluated.  Extremities: extremities normal, atraumatic, no cyanosis or edema with DTRs 2+ bilaterally Membranes:intact  Fetal Monitoring:  Baseline: 140 bpm, Variability: Good {> 6 bpm), Accelerations: 10 x 10 and Decelerations: occasional prolonged decel with spontaneous return to baseline     Labs:  No results found for this or any previous visit (from the past 24 hour(s)).  Imaging Studies:     Medications:  Scheduled . docusate sodium  100 mg Oral Daily  . prenatal multivitamin  1 tablet Oral Q1200  . sodium chloride flush  3 mL Intravenous Q12H    I have reviewed the patient's current medications.  ASSESSMENT: [id number] 8546w6d Estimated Date of Delivery: 05/02/18  Patient Active Problem List   Diagnosis Date Noted  . Abnormal antenatal test 02/19/2018  . Preeclampsia, third trimester 02/15/2018  . Exposure to the flu 01/22/2018  . LGSIL on Pap smear of cervix 10/09/2017  . Superficial thrombosis of left lower extremity 10/09/2017  . Supervision of high risk pregnancy, antepartum 09/25/2017  . History of placenta abruption leading to loss at 26 weeks 09/25/2017  . Pregnancy complicated by obesity 09/25/2017  . 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

## 2018-02-24 NOTE — Progress Notes (Signed)
5 minute prolonged deceleration down as low as 60, resolved without intervention before RN was aware of tracing. Now back up to to 140.

## 2018-02-24 NOTE — Progress Notes (Signed)
Patient ID: Sheila Georgemanda B Wilcoxson, female   DOB: [birth year], 37 y.o.   MRN: [id number] FACULTY PRACTICE ANTEPARTUM(COMPREHENSIVE) NOTE  Sheila Cline is a 37 y.o. G3P1101 at 5733w3d  who is admitted for preeclampsia.    Fetal presentation is cephalic. Length of Stay:  5  Days  Date of admission:02/19/2018  Subjective: Patient reports well and is without complaints. She denies any headaches, visual changes and RUQ/epigastric pain.  Patient reports the fetal movement as active. Patient reports uterine contraction  activity as none. Patient reports  vaginal bleeding as none. Patient describes fluid per vagina as None.  Vitals:  Blood pressure (!) 155/88, pulse 60, temperature 98 F (36.7 C), temperature source Oral, resp. rate 18, height 5' (1.524 m), weight 244 lb (110.7 kg), last menstrual period 07/26/2017, SpO2 96 %, unknown if currently breastfeeding. Vitals:   02/23/18 2359 02/24/18 0512 02/24/18 0816 02/24/18 0850  BP: 121/62 129/85 (!) 156/88 (!) 155/88  Pulse: (!) 56 63 (!) 59 60  Resp: 18 18 18 18   Temp: 97.7 F (36.5 C) 97.8 F (36.6 C) 98 F (36.7 C)   TempSrc: Oral Oral Oral   SpO2: 96% 96% 96%   Weight:      Height:       Physical Examination:  General appearance - alert, well appearing, and in no distress Fundal Height:  size equals dates Pelvic Exam:  examination not indicated Cervical Exam: Not evaluated.  Extremities: extremities normal, atraumatic, no cyanosis or edema with DTRs 2+ bilaterally Membranes:intact  Fetal Monitoring:  Baseline: 135 bpm, Variability: Good {> 6 bpm), Accelerations: Reactive and Decelerations: occasional prolonged decels lasting 2-3 minutes with spontaneous return to baseline     Labs:  No results found for this or any previous visit (from the past 24 hour(s)).  Imaging Studies:     Medications:  Scheduled . docusate sodium  100 mg Oral Daily  . prenatal multivitamin  1 tablet Oral Q1200  . sodium chloride flush  3 mL Intravenous Q12H    I have reviewed the patient's current medications.  ASSESSMENT: [id number] 2433w3d Estimated Date of Delivery: 05/02/18  Patient Active Problem List   Diagnosis Date Noted  . Abnormal antenatal test 02/19/2018  . Preeclampsia, third trimester 02/15/2018  . Exposure to the flu 01/22/2018  . LGSIL on Pap smear of cervix 10/09/2017  . Superficial thrombosis of left lower extremity 10/09/2017  . Supervision of high risk pregnancy, antepartum 09/25/2017  . History of placenta abruption leading to loss at 26 weeks 09/25/2017  . Pregnancy complicated by obesity 09/25/2017  . 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

## 2018-02-25 LAB — TYPE AND SCREEN
ABO/RH(D): A POS
ANTIBODY SCREEN: NEGATIVE

## 2018-02-25 MED ORDER — MAGNESIUM SULFATE BOLUS VIA INFUSION
4.0000 g | Freq: Once | INTRAVENOUS | Status: AC
  Administered 2018-02-25: 4 g via INTRAVENOUS
  Filled 2018-02-25: qty 500

## 2018-02-25 MED ORDER — BUTALBITAL-APAP-CAFFEINE 50-325-40 MG PO TABS
1.0000 | ORAL_TABLET | ORAL | Status: DC | PRN
  Administered 2018-02-25 – 2018-02-26 (×3): 1 via ORAL
  Filled 2018-02-25 (×4): qty 1

## 2018-02-25 MED ORDER — LACTATED RINGERS IV BOLUS (SEPSIS)
1000.0000 mL | Freq: Once | INTRAVENOUS | Status: AC
  Administered 2018-02-25: 1000 mL via INTRAVENOUS

## 2018-02-25 MED ORDER — TETANUS-DIPHTH-ACELL PERTUSSIS 5-2.5-18.5 LF-MCG/0.5 IM SUSP
0.5000 mL | Freq: Once | INTRAMUSCULAR | Status: AC
  Administered 2018-02-27: 0.5 mL via INTRAMUSCULAR
  Filled 2018-02-25: qty 0.5

## 2018-02-25 MED ORDER — MAGNESIUM SULFATE 40 G IN LACTATED RINGERS - SIMPLE
2.0000 g/h | INTRAVENOUS | Status: AC
  Administered 2018-02-26: 2 g/h via INTRAVENOUS
  Filled 2018-02-25: qty 500
  Filled 2018-02-25: qty 40

## 2018-02-25 NOTE — Progress Notes (Signed)
Faculty Note  Patient with several decels in FHR this am, improved with fluid bolus. FHT moderate variability with accels noted and overall reassuring despite occasional decels. Still, with increased decels will watch closely for decompensation. Reviewed the above with patient and husband, reviewed recommendation for RCS as fetus will likely not tolerate labor. Have started on MgSO4 for neuroprotection and she is NPO currently. For NICU consult. They verbalize understanding and are agreeable to delivery if necessary.  The risks of cesarean section were discussed with the patient; including but not limited to: infection which may require antibiotics; bleeding which may require transfusion or re-operation; injury to bowel, bladder, ureters or other surrounding organs; injury to the fetus; need for additional procedures including hysterectomy in the event of a life-threatening hemorrhage; placental abnormalities wth subsequent pregnancies, incisional problems, thromboembolic phenomenon and other postoperative/anesthesia complications. Answered all questions.    Baldemar LenisK. Meryl Jamarien Rodkey, M.D. Attending Obstetrician & Gynecologist, [facility], [facility]

## 2018-02-25 NOTE — Progress Notes (Signed)
Patient ID: Sheila Cline, female   DOB: [birth year], 37 y.o.   MRN: [id number] FACULTY PRACTICE ANTEPARTUM(COMPREHENSIVE) NOTE  Sheila Cline is a 37 y.o. G3P1101 at 6740w4d  who is admitted for preeclampsia.    Fetal presentation is cephalic. Length of Stay:  6  Days  Date of admission:02/19/2018  Subjective: Patient reports well and is without complaints. She denies any headaches, visual changes and RUQ/epigastric pain.  Patient reports the fetal movement as active. Patient reports uterine contraction  activity as none. Patient reports  vaginal bleeding as none. Patient describes fluid per vagina as None.  Vitals:  Blood pressure (!) 155/82, pulse 62, temperature 98.6 F (37 C), temperature source Oral, resp. rate 18, height 5' (1.524 m), weight 244 lb (110.7 kg), last menstrual period 07/26/2017, SpO2 100 %, unknown if currently breastfeeding. Vitals:   02/24/18 2022 02/24/18 2023 02/24/18 2352 02/25/18 0458  BP:  (!) 152/88 132/72 (!) 155/82  Pulse:  64 65 62  Resp:  18 18 18   Temp:  98.1 F (36.7 C) 98 F (36.7 C) 98.6 F (37 C)  TempSrc:  Oral Oral Oral  SpO2: 95%  98% 100%  Weight:      Height:       Physical Examination:  General appearance - alert, well appearing, and in no distress Fundal Height:  size equals dates Pelvic Exam:  examination not indicated Cervical Exam: Not evaluated.  Extremities: extremities normal, atraumatic, no cyanosis or edema with DTRs 2+ bilaterally Membranes:intact  Fetal Monitoring:  Baseline: 140 bpm, Variability: Good {> 6 bpm), Accelerations: 10x 10 and Decelerations: 3 recurrent prolonged variable decels in the past hour with spontaneous return to baseline     Labs:  No results found for this or any previous visit (from the past 24 hour(s)).  Imaging Studies:     Medications:  Scheduled . docusate sodium  100 mg Oral Daily  . prenatal multivitamin  1 tablet Oral Q1200  . sodium chloride flush  3 mL Intravenous Q12H   I have  reviewed the patient's current medications.  ASSESSMENT: [id number] 3840w4d Estimated Date of Delivery: 05/02/18  Patient Active Problem List   Diagnosis Date Noted  . Abnormal antenatal test 02/19/2018  . Preeclampsia, third trimester 02/15/2018  . Exposure to the flu 01/22/2018  . LGSIL on Pap smear of cervix 10/09/2017  . Superficial thrombosis of left lower extremity 10/09/2017  . Supervision of high risk pregnancy, antepartum 09/25/2017  . History of placenta abruption leading to loss at 26 weeks 09/25/2017  . Pregnancy complicated by obesity 09/25/2017  . 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 Will keep NPO for now - Continue close antenatal observation - Will consult with MFM on Monday regarding timing of delivery if she remains undelivered   Lanore Renderos 02/25/2018,7:03 AM

## 2018-02-25 NOTE — Consult Note (Signed)
[facility]  Prenatal Consult       02/25/2018  10:18 PM   I was asked by Dr. Earlene Plateravis to consult on this patient for possible preterm delivery.  I had the pleasure of meeting with Sheila Cline today.  She is a 37 y.o.year old 543P1101 female at 30+[redacted] weeks gestation, admitted for pre-eclampsia.  This pregnancy has otherwise only been complicated by obesity, but mom does have a history of IUFD at 26 weeks a year ago.   She has received 2 doses of betamethasone (3/18-3/19) and is currently receiving magnesium. The fetus, a boy named [place], has had decelerations today, for which mom is being closely monitored and receiving IVFs.   I explained that the neonatal intensive care team would be present for the delivery and outlined the likely delivery room course for this baby including routine resuscitation and NRP-guided approaches to the treatment of respiratory distress. We discussed other common problems associated with prematurity including respiratory distress syndrome, apnea, feeding issues, temperature regulation, and infection risk.  We briefly discussed IVH/PVL, ROP, and NEC and that these are complications associated with prematurity, but that by 30 weeks are uncommon.    We discussed the average length of stay but I noted that the actual LOS would depend on the severity of problems encountered and response to treatments.  We discussed visitation policies and the resources available while her child is in the hospital.  We discussed the importance of good nutrition and various methods of providing nutrition (parenteral hyperalimentation, gavage feedings and/or oral feeding). We discussed the benefits of human milk. I encouraged breast feeding and pumping soon after birth and outlined resources that are available to support breast feeding.    Thank you for involving us in the care of this patient. A member of our team will be available should the family have additional questions.   Time for consultation approximately 20 minutes.   _____________________ Electronically Signed By: Karie Schwalbelivia Rehman Levinson, MD, MS Neonatologist

## 2018-02-26 MED ORDER — LABETALOL HCL 200 MG PO TABS
200.0000 mg | ORAL_TABLET | Freq: Two times a day (BID) | ORAL | Status: DC
  Administered 2018-02-26 – 2018-02-27 (×3): 200 mg via ORAL
  Filled 2018-02-26 (×3): qty 1

## 2018-02-26 NOTE — Progress Notes (Signed)
Initial Nutrition Assessment  DOCUMENTATION CODES:   Morbid obesity  INTERVENTION:  Regular Diet May order double protein portions, snacks TID and from retail  NUTRITION DIAGNOSIS:  Increased nutrient needs related to (pregnancy and fetal growth requirements) as evidenced by (30 weeks IUP).  GOAL:  Patient will meet greater than or equal to 90% of their needs  MONITOR:  Weight trends  REASON FOR ASSESSMENT:  Antenatal   ASSESSMENT:   30 5/7 weeks, preeclampsia. pregravid weight 210 lbs, BMI 41, morbid obesity. 34 lbs weight gain  Pt with lights out, cool wash cloth on head and eyes closed due to headache. Daughter reports that her Mom has had a good appetite and no issue with diet tol   Diet Order:  Diet regular Room service appropriate? Yes; Fluid consistency: Thin  EDUCATION NEEDS:   No education needs have been identified at this time  Skin:  Skin Assessment: Reviewed RN Assessment  Last BM:     Height:   Ht Readings from Last 1 Encounters:  02/19/18 5' (1.524 m)    Weight:   Wt Readings from Last 1 Encounters:  02/19/18 244 lb (110.7 kg)    Ideal Body Weight:     BMI:  Body mass index is 47.65 kg/m.  Estimated Nutritional Needs:   Kcal:  2000-2200  Protein:  90-100 g  Fluid:  2.3 L    Elisabeth CaraKatherine Kimberle Stanfill M.Odis LusterEd. R.D. LDN Neonatal Nutrition Support Specialist/RD III Pager [phone]      Phone [phone]

## 2018-02-26 NOTE — Progress Notes (Signed)
Patient ID: Sheila Cline, female   DOB: 04-21-1981, 37 y.o.   MRN: [id number] FACULTY PRACTICE ANTEPARTUM(COMPREHENSIVE) NOTE  Sheila Cline is a 37 y.o. G3P1101 at 3230w5dwho is admitted for fetal monitoring and preeclampsia.   Fetal presentation is breech. Length of Stay:  7  Days  Subjective: Brief episode of SOB but resolved quickly Patient reports the fetal movement as active. Patient reports uterine contraction  activity as none. Patient reports  vaginal bleeding as none. Patient describes fluid per vagina as None.  Vitals:  Blood pressure (!) 142/92, pulse 80, temperature 98.8 F (37.1 C), temperature source Oral, resp. rate 18, height 5' (1.524 m), weight 244 lb (110.7 kg), last menstrual period 07/26/2017, SpO2 95 %, unknown if currently breastfeeding. Physical Examination:  General appearance - alert, well appearing, and in no distress Heart - normal rate and regular rhythm Abdomen - soft, nontender, nondistended Fundal Height:  size equals dates Cervical Exam: Not evaluated. . Extremities: extremities normal, atraumatic, no cyanosis or edema and Homans sign is negative, no sign of DVT  Membranes:intact  Fetal Monitoring:     Fetal Heart Rate A  Mode External filed at 02/26/2018 0700  Baseline Rate (A) 125 bpm filed at 02/26/2018 0900  Variability 6-25 BPM filed at 02/26/2018 0900  Accelerations 15 x 15 filed at 02/26/2018 0900  Decelerations None filed at 02/26/2018 0900  Multiple birth? N filed at 02/25/2018 1415     Labs:  No results found for this or any previous visit (from the past 24 hour(s)).    Medications:  Scheduled . docusate sodium  100 mg Oral Daily  . prenatal multivitamin  1 tablet Oral Q1200  . sodium chloride flush  3 mL Intravenous Q12H  . Tdap  0.5 mL Intramuscular Once   I have reviewed the patient's current medications.  ASSESSMENT: Patient Active Problem List   Diagnosis Date Noted  . Abnormal antenatal test 02/19/2018  .  Preeclampsia, third trimester 02/15/2018  . Exposure to the flu 01/22/2018  . LGSIL on Pap smear of cervix 10/09/2017  . Superficial thrombosis of left lower extremity 10/09/2017  . Supervision of high risk pregnancy, antepartum 09/25/2017  . History of placenta abruption leading to loss at 26 weeks 09/25/2017  . Pregnancy complicated by obesity 09/25/2017  . 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 FHR is reassuring with no decelerations and with accelerations noted.   Scheryl DarterJames Westlee Devita 02/26/2018,9:47 AM

## 2018-02-27 ENCOUNTER — Other Ambulatory Visit: Payer: Medicaid Other

## 2018-02-27 ENCOUNTER — Encounter: Payer: Medicaid Other | Admitting: Family Medicine

## 2018-02-27 MED ORDER — LABETALOL HCL 200 MG PO TABS
200.0000 mg | ORAL_TABLET | Freq: Three times a day (TID) | ORAL | Status: DC
  Administered 2018-02-27 (×2): 200 mg via ORAL
  Filled 2018-02-27 (×2): qty 1

## 2018-02-27 NOTE — Progress Notes (Signed)
Dr. Debroah LoopArnold aware of FHR pattern throughout this morning and last prolonged decel @ 1032. Per MD- okay for pt to be on a regular diet at this time.

## 2018-02-27 NOTE — Progress Notes (Signed)
Patient ID: Sheila Georgemanda B Topor, female   DOB: [birth year], 37 y.o.   MRN: [id number] FACULTY PRACTICE ANTEPARTUM(COMPREHENSIVE) NOTE  Sheila Cline is a 37 y.o. G3P1101 at 1435w6d who is admitted for preeclampsia and fetal heartrate decelerations.   Fetal presentation is breech. Length of Stay:  8  Days  Subjective: Blurred vision no headache Patient reports the fetal movement as active. Patient reports uterine contraction  activity as none. Patient reports  vaginal bleeding as none. Patient describes fluid per vagina as None.  Vitals:  Blood pressure (!) 153/84, pulse 60, temperature 98.7 F (37.1 C), temperature source Oral, resp. rate 20, height 5' (1.524 m), weight 113.9 kg (251 lb), last menstrual period 07/26/2017, SpO2 97 %, unknown if currently breastfeeding. Physical Examination:  General appearance - alert, well appearing, and in no distress Heart - normal rate and regular rhythm Abdomen - soft, nontender, nondistended Fundal Height:  size equals dates Cervical Exam: Not evaluated.. Extremities: extremities normal, atraumatic, no cyanosis or edema and Homans sign is negative, no sign of DVT with DTRs 2+ bilaterally Membranes:intact  Fetal Monitoring:     Fetal Heart Rate A  Mode External filed at 02/27/2018 0900  Baseline Rate (A) 130 bpm filed at 02/27/2018 0900  Variability <5 BPM filed at 02/27/2018 0900  Accelerations None filed at 02/27/2018 0900  Decelerations None filed at 02/27/2018 0900  Occasional variables noted overnight, accelerations   Labs:  No results found for this or any previous visit (from the past 24 hour(s)).   Medications:  Scheduled . docusate sodium  100 mg Oral Daily  . labetalol  200 mg Oral TID  . prenatal multivitamin  1 tablet Oral Q1200  . sodium chloride flush  3 mL Intravenous Q12H  . Tdap  0.5 mL Intramuscular Once   I have reviewed the patient's current medications.  ASSESSMENT: Patient Active Problem List   Diagnosis Date Noted   . Abnormal antenatal test 02/19/2018  . Preeclampsia, third trimester 02/15/2018  . Exposure to the flu 01/22/2018  . LGSIL on Pap smear of cervix 10/09/2017  . Superficial thrombosis of left lower extremity 10/09/2017  . Supervision of high risk pregnancy, antepartum 09/25/2017  . History of placenta abruption leading to loss at 26 weeks 09/25/2017  . Pregnancy complicated by obesity 09/25/2017  . 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 Increase labetalol for BP control  Scheryl DarterJames Veleria Barnhardt 02/27/2018,9:18 AM

## 2018-02-28 LAB — TYPE AND SCREEN
ABO/RH(D): A POS
ANTIBODY SCREEN: NEGATIVE

## 2018-02-28 MED ORDER — LABETALOL HCL 200 MG PO TABS: 300.0000 mg | ORAL_TABLET | Freq: Three times a day (TID) | ORAL | Status: DC

## 2018-02-28 MED ORDER — LABETALOL HCL 200 MG PO TABS
300.0000 mg | ORAL_TABLET | Freq: Three times a day (TID) | ORAL | Status: DC
  Administered 2018-02-28 – 2018-03-02 (×6): 300 mg via ORAL
  Filled 2018-02-28 (×6): qty 1

## 2018-02-28 NOTE — Progress Notes (Signed)
Reviewed strip. 2 minute deep variable at a 16:14. Strip is otherwise reassuring and returned to baseline line.   Federico FlakeKimberly Niles Reyonna Haack, MD, MPH, ABFM Attending Physician Faculty Practice- [facility]

## 2018-02-28 NOTE — Progress Notes (Signed)
Patient ID: Sheila Cline, female   DOB: 03/11/1981, 37 y.o.   MRN: [id number] FACULTY PRACTICE ANTEPARTUM(COMPREHENSIVE) NOTE  Sheila Cline is a 37 y.o. G3P1101 at 4240w0d who is admitted for abnormal fetal surveillance and preeclampsia.   Fetal presentation is breech. Length of Stay:  9  Days  Subjective: Visual change, no scotomata Patient reports the fetal movement as active. Patient reports uterine contraction  activity as none. Patient reports  vaginal bleeding as none. Patient describes fluid per vagina as None.  Vitals:  Blood pressure (!) 143/93, pulse 60, temperature 98.1 F (36.7 C), temperature source Oral, resp. rate 16, height 5' (1.524 m), weight 113.9 kg (251 lb), last menstrual period 07/26/2017, SpO2 96 %, unknown if currently breastfeeding. Physical Examination:  General appearance - alert, well appearing, and in no distress Heart - normal rate and regular rhythm Abdomen - soft, nontender, nondistended Fundal Height:  size equals dates Extremities: extremities normal, atraumatic, no cyanosis or edema and Homans sign is negative, no sign of DVT with DTRs 2+ bilaterally Membranes:intact  Fetal Monitoring:     Fetal Heart Rate A  Mode External filed at 02/28/2018 0800  Baseline Rate (A) 125 bpm filed at 02/28/2018 0800  Variability <5 BPM, 6-25 BPM filed at 02/28/2018 0800  Accelerations 15 x 15 filed at 02/28/2018 0800  Decelerations Variable, Prolonged filed at 02/28/2018 0800   FHR tracing reviewed  Labs:  Results for orders placed or performed during the hospital encounter of 02/19/18 (from the past 24 hour(s))  Type and screen [facility]   Collection Time: 02/28/18  5:53 AM  Result Value Ref Range   ABO/RH(D) A POS    Antibody Screen NEG    Sample Expiration      03/03/2018 Performed at [facility], [address] [place], [place] [id number]       Medications:  Scheduled . docusate sodium  100 mg Oral Daily  . labetalol   300 mg Oral Q8H  . prenatal multivitamin  1 tablet Oral Q1200  . sodium chloride flush  3 mL Intravenous Q12H   I have reviewed the patient's current medications.  ASSESSMENT: Patient Active Problem List   Diagnosis Date Noted  . Abnormal antenatal test 02/19/2018  . Preeclampsia, third trimester 02/15/2018  . Exposure to the flu 01/22/2018  . LGSIL on Pap smear of cervix 10/09/2017  . Superficial thrombosis of left lower extremity 10/09/2017  . Supervision of high risk pregnancy, antepartum 09/25/2017  . History of placenta abruption leading to loss at 26 weeks 09/25/2017  . Pregnancy complicated by obesity 09/25/2017  . 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 Increased labetalol for improved BP control. US tomorrow for growth  Scheryl DarterJames Mehdi Gironda 02/28/2018,9:29 AM

## 2018-02-28 NOTE — Plan of Care (Signed)
POC discussed with pt, no questions or concerns at this time.   

## 2018-02-28 NOTE — Progress Notes (Signed)
MD notified that pt has significant swelling on lower abdomen and mons.  Pt reports that it has developed overnight.  Significant swelling noted, skin is taunt and pale in color.

## 2018-03-01 ENCOUNTER — Inpatient Hospital Stay (HOSPITAL_COMMUNITY): Payer: Medicaid Other

## 2018-03-01 NOTE — Progress Notes (Signed)
Dr. Despina HiddenEure at bedside to discuss plan of care. Dr. Despina HiddenEure has reviewed fetal heart rate strip. No new orders given. Will continue to monitor. Carmelina DaneERRI L Zeena Starkel, RN

## 2018-03-01 NOTE — Progress Notes (Signed)
Patient ID: Sheila Cline, female   DOB: 1981/05/18, 37 y.o.   MRN: [id number] FACULTY PRACTICE ANTEPARTUM(COMPREHENSIVE) NOTE  Sheila Georgemanda B Deshler is a 37 y.o. [phone] with Estimated Date of Delivery: 05/02/18    10675w1d  who is admitted for pre eclampsia.    Fetal presentation is breech. Length of Stay:  10  Days  Date of admission:02/19/2018  Subjective:  Patient reports the fetal movement as active. Patient reports uterine contraction  activity as none. Patient reports  vaginal bleeding as none. Patient describes fluid per vagina as None.  Vitals:  Blood pressure (!) 141/90, pulse 65, temperature (!) 97.3 F (36.3 C), temperature source Oral, resp. rate 18, height 5' (1.524 m), weight 251 lb (113.9 kg), last menstrual period 07/26/2017, SpO2 99 %, unknown if currently breastfeeding. Vitals:   03/01/18 0022 03/01/18 0403 03/01/18 0404 03/01/18 0740  BP: 137/79  (!) 141/87 (!) 141/90  Pulse: 73  65   Resp: 17  18 18   Temp: 98.4 F (36.9 C)  97.7 F (36.5 C) (!) 97.3 F (36.3 C)  TempSrc: Oral  Oral Oral  SpO2: 99% 99%    Weight:      Height:       Physical Examination:  General appearance - alert, well appearing, and in no distress Abdomen - soft, nontender, gravid benign Fundal Height:  size equals dates Pelvic Exam:  examination not indicated Cervical Exam: Not evaluated.  Extremities: extremities normal, atraumatic, no cyanosis or edema with DTRs  Membranes:intact  Fetal Monitoring:  FHR 130s reactive with occasional variables, rare fetal bradycardic episodes, Category 2     Labs:  No results found for this or any previous visit (from the past 24 hour(s)).  Imaging Studies:    Pending sonogram today  Medications:  Scheduled . docusate sodium  100 mg Oral Daily  . labetalol  300 mg Oral Q8H  . prenatal multivitamin  1 tablet Oral Q1200  . sodium chloride flush  3 mL Intravenous Q12H   I have reviewed the patient's current medications.  ASSESSMENT: [id number] 1275w1d  Estimated Date of Delivery: 05/02/18  Intermittent variables/fetal bradycardic episodes(longest so far 6 minutes) Pre eclampsia, BP stable on labetalol 300 TID  Patient Active Problem List   Diagnosis Date Noted  . Abnormal antenatal test 02/19/2018  . Preeclampsia, third trimester 02/15/2018  . Exposure to the flu 01/22/2018  . LGSIL on Pap smear of cervix 10/09/2017  . Superficial thrombosis of left lower extremity 10/09/2017  . Supervision of high risk pregnancy, antepartum 09/25/2017  . History of placenta abruption leading to loss at 26 weeks 09/25/2017  . Pregnancy complicated by obesity 09/25/2017  . 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

## 2018-03-02 ENCOUNTER — Inpatient Hospital Stay (HOSPITAL_COMMUNITY): Payer: Medicaid Other | Admitting: Anesthesiology

## 2018-03-02 ENCOUNTER — Encounter (HOSPITAL_COMMUNITY)
Admission: AD | Disposition: A | Discharge status: Home / Self Care | Payer: Self-pay | Source: Ambulatory Visit | Attending: Obstetrics & Gynecology

## 2018-03-02 ENCOUNTER — Encounter (HOSPITAL_COMMUNITY): Payer: Self-pay

## 2018-03-02 DIAGNOSIS — Z3A31 31 weeks gestation of pregnancy: Secondary | ICD-10-CM

## 2018-03-02 DIAGNOSIS — O1490 Unspecified pre-eclampsia, unspecified trimester: Secondary | ICD-10-CM

## 2018-03-02 DIAGNOSIS — O1414 Severe pre-eclampsia complicating childbirth: Secondary | ICD-10-CM

## 2018-03-02 LAB — CBC
HCT: 36.2 % (ref 36.0–46.0)
Hemoglobin: 11.9 g/dL — ABNORMAL LOW (ref 12.0–15.0)
MCH: 28.7 pg (ref 26.0–34.0)
MCHC: 32.9 g/dL (ref 30.0–36.0)
MCV: 87.4 fL (ref 78.0–100.0)
PLATELETS: 139 10*3/uL — AB (ref 150–400)
RBC: 4.14 MIL/uL (ref 3.87–5.11)
RDW: 15.1 % (ref 11.5–15.5)
WBC: 12.6 10*3/uL — AB (ref 4.0–10.5)

## 2018-03-02 LAB — CREATININE, SERUM
Creatinine, Ser: 0.72 mg/dL (ref 0.44–1.00)
GFR calc non Af Amer: >60 mL/min (ref >60)

## 2018-03-02 SURGERY — Surgical Case
Anesthesia: Spinal

## 2018-03-02 MED ORDER — MAGNESIUM SULFATE 40 G IN LACTATED RINGERS - SIMPLE: 2.0000 g/h | INTRAVENOUS | Status: DC

## 2018-03-02 MED ORDER — LABETALOL HCL 5 MG/ML IV SOLN
20.0000 mg | INTRAVENOUS | Status: DC | PRN
Start: 2018-03-02 — End: 2018-03-06

## 2018-03-02 MED ORDER — FENTANYL CITRATE (PF) 100 MCG/2ML IJ SOLN
INTRAMUSCULAR | Status: AC
  Filled 2018-03-02: qty 2

## 2018-03-02 MED ORDER — OXYCODONE HCL 5 MG PO TABS
10.0000 mg | ORAL_TABLET | ORAL | Status: DC | PRN
  Administered 2018-03-03 – 2018-03-05 (×3): 10 mg via ORAL
  Filled 2018-03-02 (×3): qty 2

## 2018-03-02 MED ORDER — SIMETHICONE 80 MG PO CHEW
80.0000 mg | CHEWABLE_TABLET | ORAL | Status: DC
  Administered 2018-03-03 – 2018-03-06 (×4): 80 mg via ORAL
  Filled 2018-03-02 (×4): qty 1

## 2018-03-02 MED ORDER — ONDANSETRON HCL 4 MG/2ML IJ SOLN
INTRAMUSCULAR | Status: DC | PRN
  Administered 2018-03-02: 4 mg via INTRAVENOUS

## 2018-03-02 MED ORDER — SIMETHICONE 80 MG PO CHEW
80.0000 mg | CHEWABLE_TABLET | Freq: Three times a day (TID) | ORAL | Status: DC
  Administered 2018-03-02 – 2018-03-06 (×12): 80 mg via ORAL
  Filled 2018-03-02 (×12): qty 1

## 2018-03-02 MED ORDER — TETANUS-DIPHTH-ACELL PERTUSSIS 5-2.5-18.5 LF-MCG/0.5 IM SUSP: 0.5000 mL | Freq: Once | INTRAMUSCULAR | Status: DC

## 2018-03-02 MED ORDER — OXYTOCIN 10 UNIT/ML IJ SOLN
INTRAMUSCULAR | Status: AC
  Filled 2018-03-02: qty 4

## 2018-03-02 MED ORDER — FENTANYL CITRATE (PF) 100 MCG/2ML IJ SOLN
INTRAMUSCULAR | Status: DC | PRN
  Administered 2018-03-02: 10 ug via INTRATHECAL

## 2018-03-02 MED ORDER — LACTATED RINGERS IV SOLN
INTRAVENOUS | Status: DC | PRN
  Administered 2018-03-02 (×2): via INTRAVENOUS

## 2018-03-02 MED ORDER — DIBUCAINE 1 % RE OINT: 1.0000 "application " | TOPICAL_OINTMENT | RECTAL | Status: DC | PRN

## 2018-03-02 MED ORDER — MORPHINE SULFATE (PF) 0.5 MG/ML IJ SOLN
INTRAMUSCULAR | Status: AC
  Filled 2018-03-02: qty 10

## 2018-03-02 MED ORDER — ACETAMINOPHEN 325 MG PO TABS
650.0000 mg | ORAL_TABLET | ORAL | Status: DC | PRN
  Administered 2018-03-02 – 2018-03-05 (×2): 650 mg via ORAL
  Filled 2018-03-02 (×2): qty 2

## 2018-03-02 MED ORDER — MAGNESIUM SULFATE BOLUS VIA INFUSION: 4.0000 g | Freq: Once | INTRAVENOUS | Status: DC

## 2018-03-02 MED ORDER — SIMETHICONE 80 MG PO CHEW: 80.0000 mg | CHEWABLE_TABLET | ORAL | Status: DC | PRN

## 2018-03-02 MED ORDER — ENOXAPARIN SODIUM 60 MG/0.6ML ~~LOC~~ SOLN
60.0000 mg | SUBCUTANEOUS | Status: DC
  Administered 2018-03-03 – 2018-03-06 (×4): 60 mg via SUBCUTANEOUS
  Filled 2018-03-02 (×5): qty 0.6

## 2018-03-02 MED ORDER — OXYTOCIN 40 UNITS IN LACTATED RINGERS INFUSION - SIMPLE MED: 2.5000 [IU]/h | INTRAVENOUS | Status: AC

## 2018-03-02 MED ORDER — HYDRALAZINE HCL 20 MG/ML IJ SOLN: 10.0000 mg | Freq: Once | INTRAMUSCULAR | Status: DC | PRN

## 2018-03-02 MED ORDER — BUPIVACAINE IN DEXTROSE 0.75-8.25 % IT SOLN
INTRATHECAL | Status: DC | PRN
  Administered 2018-03-02: 1.4 mL via INTRATHECAL

## 2018-03-02 MED ORDER — OXYTOCIN 10 UNIT/ML IJ SOLN
INTRAVENOUS | Status: DC | PRN
  Administered 2018-03-02: 40 [IU] via INTRAVENOUS

## 2018-03-02 MED ORDER — DEXAMETHASONE SODIUM PHOSPHATE 10 MG/ML IJ SOLN
INTRAMUSCULAR | Status: DC | PRN
  Administered 2018-03-02: 10 mg via INTRAVENOUS

## 2018-03-02 MED ORDER — SOD CITRATE-CITRIC ACID 500-334 MG/5ML PO SOLN
ORAL | Status: AC
  Administered 2018-03-02: 30 mL
  Filled 2018-03-02: qty 15

## 2018-03-02 MED ORDER — WITCH HAZEL-GLYCERIN EX PADS: 1.0000 "application " | MEDICATED_PAD | CUTANEOUS | Status: DC | PRN

## 2018-03-02 MED ORDER — DEXAMETHASONE SODIUM PHOSPHATE 10 MG/ML IJ SOLN
INTRAMUSCULAR | Status: AC
  Filled 2018-03-02: qty 1

## 2018-03-02 MED ORDER — SENNOSIDES-DOCUSATE SODIUM 8.6-50 MG PO TABS
2.0000 | ORAL_TABLET | ORAL | Status: DC
  Administered 2018-03-03 – 2018-03-06 (×4): 2 via ORAL
  Filled 2018-03-02 (×4): qty 2

## 2018-03-02 MED ORDER — SCOPOLAMINE 1 MG/3DAYS TD PT72
MEDICATED_PATCH | TRANSDERMAL | Status: DC | PRN
  Administered 2018-03-02: 1 via TRANSDERMAL

## 2018-03-02 MED ORDER — IBUPROFEN 600 MG PO TABS
600.0000 mg | ORAL_TABLET | Freq: Four times a day (QID) | ORAL | Status: DC
  Administered 2018-03-02 – 2018-03-06 (×16): 600 mg via ORAL
  Filled 2018-03-02 (×16): qty 1

## 2018-03-02 MED ORDER — ONDANSETRON HCL 4 MG/2ML IJ SOLN
INTRAMUSCULAR | Status: AC
  Filled 2018-03-02: qty 2

## 2018-03-02 MED ORDER — COCONUT OIL OIL
1.0000 "application " | TOPICAL_OIL | Status: DC | PRN
  Administered 2018-03-03: 1 via TOPICAL
  Filled 2018-03-02: qty 120

## 2018-03-02 MED ORDER — MORPHINE SULFATE (PF) 0.5 MG/ML IJ SOLN
INTRAMUSCULAR | Status: DC | PRN
  Administered 2018-03-02: .2 mg via INTRATHECAL

## 2018-03-02 MED ORDER — MENTHOL 3 MG MT LOZG: 1.0000 | LOZENGE | OROMUCOSAL | Status: DC | PRN

## 2018-03-02 MED ORDER — OXYCODONE HCL 5 MG PO TABS
5.0000 mg | ORAL_TABLET | ORAL | Status: DC | PRN
  Administered 2018-03-03 – 2018-03-04 (×5): 5 mg via ORAL
  Filled 2018-03-02 (×5): qty 1

## 2018-03-02 MED ORDER — DIPHENHYDRAMINE HCL 25 MG PO CAPS: 25.0000 mg | ORAL_CAPSULE | Freq: Four times a day (QID) | ORAL | Status: DC | PRN

## 2018-03-02 MED ORDER — CEFAZOLIN SODIUM-DEXTROSE 2-4 GM/100ML-% IV SOLN
2.0000 g | INTRAVENOUS | Status: DC
  Filled 2018-03-02: qty 100

## 2018-03-02 MED ORDER — PRENATAL MULTIVITAMIN CH
1.0000 | ORAL_TABLET | Freq: Every day | ORAL | Status: DC
  Administered 2018-03-03 – 2018-03-06 (×5): 1 via ORAL
  Filled 2018-03-02 (×5): qty 1

## 2018-03-02 MED ORDER — SCOPOLAMINE 1 MG/3DAYS TD PT72
MEDICATED_PATCH | TRANSDERMAL | Status: AC
  Filled 2018-03-02: qty 1

## 2018-03-02 MED ORDER — LACTATED RINGERS IV SOLN: INTRAVENOUS | Status: DC

## 2018-03-02 SURGICAL SUPPLY — 31 items
CHLORAPREP W/TINT 26ML (MISCELLANEOUS) ×3 IMPLANT
CLAMP CORD UMBIL (MISCELLANEOUS) IMPLANT
DRSG OPSITE POSTOP 4X10 (GAUZE/BANDAGES/DRESSINGS) ×3 IMPLANT
ELECT REM PT RETURN 9FT ADLT (ELECTROSURGICAL) ×3
ELECTRODE REM PT RTRN 9FT ADLT (ELECTROSURGICAL) ×1 IMPLANT
EXTRACTOR VACUUM M CUP 4 TUBE (SUCTIONS) IMPLANT
EXTRACTOR VACUUM M CUP 4' TUBE (SUCTIONS)
GAUZE SPONGE 4X4 12PLY STRL LF (GAUZE/BANDAGES/DRESSINGS) ×3 IMPLANT
GLOVE BIOGEL PI IND STRL 6.5 (GLOVE) ×1 IMPLANT
GLOVE BIOGEL PI IND STRL 7.0 (GLOVE) ×1 IMPLANT
GLOVE BIOGEL PI INDICATOR 6.5 (GLOVE) ×2
GLOVE BIOGEL PI INDICATOR 7.0 (GLOVE) ×2
GLOVE SURG SS PI 6.0 STRL IVOR (GLOVE) ×3 IMPLANT
GOWN STRL REUS W/TWL LRG LVL3 (GOWN DISPOSABLE) ×6 IMPLANT
HEMOSTAT ARISTA ABSORB 3G PWDR (MISCELLANEOUS) ×3 IMPLANT
KIT ABG SYR 3ML LUER SLIP (SYRINGE) IMPLANT
NEEDLE HYPO 25X5/8 SAFETYGLIDE (NEEDLE) IMPLANT
NS IRRIG 1000ML POUR BTL (IV SOLUTION) ×3 IMPLANT
PACK C SECTION WH (CUSTOM PROCEDURE TRAY) ×3 IMPLANT
PAD ABD 8X10 STRL (GAUZE/BANDAGES/DRESSINGS) ×3 IMPLANT
PAD OB MATERNITY 4.3X12.25 (PERSONAL CARE ITEMS) ×3 IMPLANT
PENCIL SMOKE EVAC W/HOLSTER (ELECTROSURGICAL) ×3 IMPLANT
RTRCTR C-SECT PINK 25CM LRG (MISCELLANEOUS) IMPLANT
SEPRAFILM MEMBRANE 5X6 (MISCELLANEOUS) IMPLANT
SUT PLAIN 0 NONE (SUTURE) IMPLANT
SUT PLAIN 2 0 XLH (SUTURE) ×3 IMPLANT
SUT VIC AB 0 CT1 36 (SUTURE) ×12 IMPLANT
SUT VIC AB 4-0 KS 27 (SUTURE) ×3 IMPLANT
TAPE CLOTH SURG 4X10 WHT LF (GAUZE/BANDAGES/DRESSINGS) ×3 IMPLANT
TOWEL OR 17X24 6PK STRL BLUE (TOWEL DISPOSABLE) ×3 IMPLANT
TRAY FOLEY BAG SILVER LF 14FR (SET/KITS/TRAYS/PACK) ×3 IMPLANT

## 2018-03-02 NOTE — Anesthesia Preprocedure Evaluation (Signed)
Anesthesia Evaluation  Patient identified by MRN, date of birth, ID band Patient awake    Reviewed: Allergy & Precautions, NPO status , Patient's Chart, lab work & pertinent test results  Airway Mallampati: II  TM Distance: >3 FB Neck ROM: Full    Dental no notable dental hx.    Pulmonary neg pulmonary ROS,    Pulmonary exam normal breath sounds clear to auscultation       Cardiovascular negative cardio ROS Normal cardiovascular exam Rhythm:Regular Rate:Normal     Neuro/Psych negative neurological ROS  negative psych ROS   GI/Hepatic negative GI ROS, Neg liver ROS,   Endo/Other  negative endocrine ROS  Renal/GU negative Renal ROS  negative genitourinary   Musculoskeletal negative musculoskeletal ROS (+)   Abdominal   Peds negative pediatric ROS (+)  Hematology negative hematology ROS (+)   Anesthesia Other Findings   Reproductive/Obstetrics (+) Pregnancy (pre eclampsia)                             Anesthesia Physical Anesthesia Plan  ASA: II and emergent  Anesthesia Plan: Spinal   Post-op Pain Management:    Induction:   PONV Risk Score and Plan: 3 and Ondansetron and Treatment may vary due to age or medical condition  Airway Management Planned: Natural Airway  Additional Equipment:   Intra-op Plan:   Post-operative Plan:   Informed Consent: I have reviewed the patients History and Physical, chart, labs and discussed the procedure including the risks, benefits and alternatives for the proposed anesthesia with the patient or authorized representative who has indicated his/her understanding and acceptance.   Dental advisory given  Plan Discussed with: CRNA  Anesthesia Plan Comments:         Anesthesia Quick Evaluation

## 2018-03-02 NOTE — Anesthesia Postprocedure Evaluation (Signed)
Anesthesia Post Note  Patient: Sheila Cline  Procedure(s) Performed: CESAREAN SECTION (N/A )     Patient location during evaluation: [facility] Anesthesia Type: Spinal Level of consciousness: awake and alert and oriented Pain management: pain level controlled Vital Signs Assessment: post-procedure vital signs reviewed and stable Respiratory status: spontaneous breathing and nonlabored ventilation Cardiovascular status: stable Postop Assessment: no headache, no backache, spinal receding, no apparent nausea or vomiting, patient able to bend at knees and adequate PO intake Anesthetic complications: no    Last Vitals:  Vitals:   03/02/18 1418 03/02/18 1515  BP: (!) 154/99 (!) 142/88  Pulse: 60 (!) 59  Resp: 16 16  Temp: 36.9 C 37.2 C  SpO2:  95%    Last Pain:  Vitals:   03/02/18 1608  TempSrc:   PainSc: 4    Pain Goal: Patients Stated Pain Goal: 2 (02/28/18 1953)               Laban EmperorMalinova,Estelle Skibicki Hristova

## 2018-03-02 NOTE — Progress Notes (Signed)
Patient ID: Sheila Cline, female   DOB: [birth year], 37 y.o.   MRN: [id number] FACULTY PRACTICE ANTEPARTUM(COMPREHENSIVE) NOTE  Sheila Cline is a 37 y.o. G3P1101 at 2665w2d by} who is admitted for preeclampsia and abnormal fetal heart rate tracing.   Fetal presentation is breech. Length of Stay:  11  Days  Subjective:  Patient reports the fetal movement as active. Patient reports uterine contraction  activity as none. Patient reports  vaginal bleeding as none. Patient describes fluid per vagina as None.  Vitals:  Blood pressure (!) 151/91, pulse 65, temperature 98.4 F (36.9 C), temperature source Oral, resp. rate 18, height 5' (1.524 m), weight 113.9 kg (251 lb), last menstrual period 07/26/2017, SpO2 95 %, unknown if currently breastfeeding. Physical Examination:  General appearance - alert, well appearing, and in no distress Heart - normal rate and regular rhythm Abdomen - soft, nontender, nondistended Fundal Height:  size equals dates Cervical Exam: Not evaluated.. Extremities: extremities normal, atraumatic, no cyanosis or edema and Homans sign is negative, no sign of DVT Membranes:intact  Fetal Monitoring:      Fetal Heart Rate A  Mode External filed at 03/02/2018 0700  Baseline Rate (A) 130 bpm filed at 03/02/2018 0700  Variability <5 BPM, 6-25 BPM filed at 03/02/2018 0700  Accelerations 10 x 10 filed at 03/02/2018 0500  Decelerations Variable filed at 03/02/2018 0700  Multiple birth? N filed at 02/25/2018 1415  Fetal Heart Rate Fetus B  Fetal Heart Rate Fetus C   Decreased variability and more frequent variables and deep prolonged (3 min ) decelerations  Labs:  No results found for this or any previous visit (from the past 24 hour(s)).   Medications:  Scheduled . docusate sodium  100 mg Oral Daily  . labetalol  300 mg Oral Q8H  . prenatal multivitamin  1 tablet Oral Q1200  . sodium chloride flush  3 mL Intravenous Q12H  . sodium citrate-citric acid       I have  reviewed the patient's current medications.  ASSESSMENT: Patient Active Problem List   Diagnosis Date Noted  . Abnormal antenatal test 02/19/2018  . Preeclampsia, third trimester 02/15/2018  . Exposure to the flu 01/22/2018  . LGSIL on Pap smear of cervix 10/09/2017  . Superficial thrombosis of left lower extremity 10/09/2017  . Supervision of high risk pregnancy, antepartum 09/25/2017  . History of placenta abruption leading to loss at 26 weeks 09/25/2017  . Pregnancy complicated by obesity 09/25/2017  . 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 The risks of cesarean section discussed with the patient included but were not limited to: bleeding which may require transfusion or reoperation; infection which may require antibiotics; injury to bowel, bladder, ureters or other surrounding organs; injury to the fetus; need for additional procedures including hysterectomy in the event of a life-threatening hemorrhage; placental abnormalities wth subsequent pregnancies, incisional problems, thromboembolic phenomenon and other postoperative/anesthesia complications. The patient concurred with the proposed plan, giving informed written consent for the procedure.   Patient has been NPO since 0800 she will remain NPO for procedure. Anesthesia and OR aware. Preoperative prophylactic antibiotics and SCDs ordered on call to the OR.  To OR when ready.    Scheryl DarterJames Keitra Carusone 03/02/2018,9:31 AM

## 2018-03-02 NOTE — Transfer of Care (Signed)
Immediate Anesthesia Transfer of Care Note  Patient: Sheila Cline B Geter  Procedure(s) Performed: CESAREAN SECTION (N/A )  Patient Location: PACU  Anesthesia Type:Spinal  Level of Consciousness: awake, alert  and oriented  Airway & Oxygen Therapy: Patient Spontanous Breathing  Post-op Assessment: Report given to RN and Post -op Vital signs reviewed and stable  Post vital signs: Reviewed and stable  Last Vitals:  Vitals Value Taken Time  BP    Temp    Pulse    Resp    SpO2      Last Pain:  Vitals:   03/02/18 0800  TempSrc: Oral  PainSc:       Patients Stated Pain Goal: 2 (02/28/18 1953)  Complications: No apparent anesthesia complications

## 2018-03-02 NOTE — Op Note (Signed)
Sheila Cline PROCEDURE DATE: 02/19/2018 - 03/02/2018  PREOPERATIVE DIAGNOSIS: Intrauterine pregnancy at  6475w2d weeks gestation; non-reassuring fetal status and severe preeclampsia  POSTOPERATIVE DIAGNOSIS: The same  PROCEDURE:     Cesarean Section  SURGEON:  Dr. Catalina AntiguaPeggy Corlis Angelica  ASSISTANT: none  INDICATIONS: Sheila Cline is a 37 y.o. [id number] at 6475w2d scheduled for cesarean section secondary to non-reassuring fetal status and severe preeclampsia.  The risks of cesarean section discussed with the patient included but were not limited to: bleeding which may require transfusion or reoperation; infection which may require antibiotics; injury to bowel, bladder, ureters or other surrounding organs; injury to the fetus; need for additional procedures including hysterectomy in the event of a life-threatening hemorrhage; placental abnormalities wth subsequent pregnancies, incisional problems, thromboembolic phenomenon and other postoperative/anesthesia complications. The patient concurred with the proposed plan, giving informed written consent for the procedure.    FINDINGS:  Viable female infant in breech presentation.  Apgars 3 and 8.  Clear amniotic fluid.  Intact placenta, three vessel cord.  Normal uterus, fallopian tubes and ovaries bilaterally.  ANESTHESIA:    Spinal INTRAVENOUS FLUIDS:1400 ml ESTIMATED BLOOD LOSS: 600 mL ml URINE OUTPUT:  300 ml SPECIMENS: Placenta sent to pathology COMPLICATIONS: None immediate  PROCEDURE IN DETAIL:  The patient received intravenous antibiotics and had sequential compression devices applied to her lower extremities while in the preoperative area.  She was then taken to the operating room where anesthesia was induced and was found to be adequate. A foley catheter was placed into her bladder and attached to Sheila Cline gravity. She was then placed in a dorsal supine position with a leftward tilt, and prepped and draped in a sterile manner. After an adequate timeout  was performed, a Pfannenstiel skin incision was made with scalpel and carried through to the underlying layer of fascia. The fascia was incised in the midline and this incision was extended bilaterally using the Mayo scissors. Kocher clamps were applied to the superior aspect of the fascial incision and the underlying rectus muscles were dissected off bluntly. A similar process was carried out on the inferior aspect of the facial incision. The rectus muscles were separated in the midline bluntly and the peritoneum was entered bluntly. The Alexis self-retaining retractor was introduced into the abdominal cavity. Attention was turned to the lower uterine segment where a transverse hysterotomy was made with a scalpel and extended bilaterally bluntly. The infant was successfully delivered, and cord was clamped and cut and infant was handed over to awaiting neonatology team. Uterine massage was then administered and the placenta delivered intact with three-vessel cord. The uterus was cleared of clot and debris.  The hysterotomy was closed with 0 Vicryl in a running locked fashion, and an imbricating layer was also placed with a 0 Vicryl. Overall, excellent hemostasis was noted. The pelvis copiously irrigated and cleared of all clot and debris. Hemostasis was confirmed on all surfaces.  The peritoneum and the muscles were reapproximated using 0 vicryl interrupted stitches. The fascia was then closed using 0 Vicryl in a running fashion.  The subcutaneous layer was reapproximated with plain gut and the skin was closed in a subcuticular fashion using 3.0 Vicryl. The patient tolerated the procedure well. Sponge, lap, instrument and needle counts were correct x 2. She was taken to the recovery room in stable condition.    Sheila Cline ConstantMD  03/02/2018 12:43 PM

## 2018-03-02 NOTE — Anesthesia Procedure Notes (Signed)
Spinal  Patient location during procedure: OR Staffing Anesthesiologist: Duke Weisensel, MD Performed: anesthesiologist  Preanesthetic Checklist Completed: patient identified, site marked, surgical consent, pre-op evaluation, timeout performed, IV checked, risks and benefits discussed and monitors and equipment checked Spinal Block Patient position: sitting Prep: DuraPrep Patient monitoring: heart rate, continuous pulse ox and blood pressure Approach: midline Location: L3-4 Injection technique: single-shot Needle Needle type: Sprotte  Needle gauge: 24 G Needle length: 9 cm Additional Notes Expiration date of kit checked and confirmed. Patient tolerated procedure well, without complications.       

## 2018-03-03 LAB — CBC
HCT: 30.5 % — ABNORMAL LOW (ref 36.0–46.0)
HEMOGLOBIN: 10.4 g/dL — AB (ref 12.0–15.0)
MCH: 29.5 pg (ref 26.0–34.0)
MCHC: 34.1 g/dL (ref 30.0–36.0)
MCV: 86.4 fL (ref 78.0–100.0)
PLATELETS: 159 10*3/uL (ref 150–400)
RBC: 3.53 MIL/uL — AB (ref 3.87–5.11)
RDW: 15.2 % (ref 11.5–15.5)
WBC: 21.6 10*3/uL — AB (ref 4.0–10.5)

## 2018-03-03 NOTE — Progress Notes (Signed)
Postpartum Day 1: Repeat Cesarean Delivery at 518w2d for NRFHT; failed IOL for severe preeclampsia  Subjective: Denies any headaches, visual symptoms, RUQ/epigastric pain. Breastfeeding, baby doing well in NICU as per patient. She reports tolerating PO, + flatus, + BM and no problems voiding.    Objective: Vital signs in last 24 hours: Temp:  [97.8 F (36.6 C)-98.9 F (37.2 C)] 97.8 F (36.6 C) (03/30 1245) Pulse Rate:  [59-79] 79 (03/30 1245) Resp:  [16-18] 18 (03/30 1245) BP: (91-150)/(60-94) 130/81 (03/30 1245) SpO2:  [95 %-100 %] 96 % (03/30 1245)  Physical Exam:  General: alert and no distress  Lungs: CTAB Heart: RRR Lochia: appropriate Uterine Fundus: firm Incision: healing well, no significant drainage DVT Evaluation: No evidence of DVT seen on physical exam. Negative Homan's sign. No cords or calf tenderness. No significant calf/ankle edema. 2+DTRs.  Recent Labs    03/02/18 0945 03/03/18 0540  HGB 11.9* 10.4*  HCT 36.2 30.5*    Assessment/Plan: Status post Cesarean section. Doing well postoperatively.  Stable BP, no meds for now. No other severe features. Desires POPs for contraception. Continue current postpartum care.  Jaynie CollinsUgonna Quaneshia Wareing, MD 03/03/2018, 2:51 PM

## 2018-03-03 NOTE — Progress Notes (Signed)
Patient off unit in NICU

## 2018-03-03 NOTE — Lactation Note (Addendum)
This note was copied from a baby's chart. Lactation Consultation Note  Patient Name: Sheila Cline ZOXWR'UToday's Date: 03/03/2018 Reason for consult: Initial assessment;NICU baby;Preterm <34wks;Infant < 6lbs  Mom with baby in NICU. Baby was born at 3031w 2d and mom is exclusively pumping. She has been pumping every 3 hours, her nipples are already getting sore. Resized mom with a 27 mm flanges, and she voiced it's a much better fit, requested her RN some coconut oil and instructed mom how to used it prior pumping and for breast massage. She does not have a pump at home, but [facility] and [facility]; discussed options to get a pump prior her discharge.  Taught mom how to hand express, she had almost forgot about it, she BF 13 years ago for 8 months, but after losing her 2nd child in March last year her milk would not go away, she tried to cut her supply for several weeks, it finally stopped in May. Mom has history of good milk supply; but currently pumping about 3 cc at a time, explained to her that it may take a bit longer for her milk to come in with a premature baby and praised her for her efforts.   Mom will continue pumping for 15 minutes every 3 hours and at least once at night; and she'll be removing her bra to do so to avoid restricted circulation. She'll also hand express prior pumping and use coconut oil. BF brochure, BF resources, pumping log and galactagogues were discussed, mom is aware of LC services and will call PRN.  Maternal Data Formula Feeding for Exclusion: Yes Reason for exclusion: Admission to Intensive Care Unit (ICU) post-partum Has patient been taught Hand Expression?: Yes Does the patient have breastfeeding experience prior to this delivery?: Yes  Interventions Interventions: Breast feeding basics reviewed;Breast massage;Breast compression;Coconut oil;Hand express;DEBP  Lactation Tools Discussed/Used Tools: Pump;Coconut  oil;Flanges Flange Size: 27 Breast pump type: Double-Electric Breast Pump WIC Program: Yes Pump Review: Setup, frequency, and cleaning Initiated by:: RN Date initiated:: 03/03/18   Consult Status Consult Status: Follow-up Date: 03/04/18 Follow-up type: In-patient    Sheila Cline Sheila Cline 03/03/2018, 4:10 PM

## 2018-03-04 MED ORDER — DOCUSATE SODIUM 100 MG PO CAPS
100.0000 mg | ORAL_CAPSULE | Freq: Two times a day (BID) | ORAL | Status: DC | PRN
  Administered 2018-03-04 – 2018-03-05 (×2): 100 mg via ORAL
  Filled 2018-03-04 (×2): qty 1

## 2018-03-04 MED ORDER — BISACODYL 10 MG RE SUPP
10.0000 mg | Freq: Every day | RECTAL | Status: DC | PRN
  Administered 2018-03-04: 10 mg via RECTAL
  Filled 2018-03-04: qty 1

## 2018-03-04 MED ORDER — AMLODIPINE BESYLATE 10 MG PO TABS
10.0000 mg | ORAL_TABLET | Freq: Every day | ORAL | Status: DC
  Administered 2018-03-04 – 2018-03-06 (×3): 10 mg via ORAL
  Filled 2018-03-04 (×3): qty 1

## 2018-03-04 NOTE — Progress Notes (Signed)
Postpartum Day 2: Repeat Cesarean Delivery at 1834w2d for NRFHT; failed IOL for severe preeclampsia  Subjective: Denies any headaches, visual symptoms, RUQ/epigastric pain. Breastfeeding, baby doing well in NICU as per patient. She reports tolerating PO, + flatus and no problems voiding.    Objective: Vital signs in last 24 hours: Temp:  [97.7 F (36.5 C)-98.3 F (36.8 C)] 98.3 F (36.8 C) (03/31 0016) Pulse Rate:  [65-101] 82 (03/31 0530) Resp:  [16-19] 19 (03/31 0530) BP: (91-156)/(65-95) 156/95 (03/31 0530) SpO2:  [96 %-100 %] 97 % (03/31 0530)  Patient Vitals for the past 24 hrs:  BP Temp Temp src Pulse Resp SpO2  03/04/18 0530 (!) 156/95 - - 82 19 97 %  03/04/18 0016 115/74 98.3 F (36.8 C) - (!) 101 16 98 %  03/03/18 1631 121/72 97.7 F (36.5 C) Oral 92 18 98 %  03/03/18 1245 130/81 97.8 F (36.6 C) Oral 79 18 96 %  03/03/18 0825 91/65 97.8 F (36.6 C) Oral 65 18 100 %   Physical Exam:  General: alert and no distress  Lungs: CTAB Heart: RRR Lochia: appropriate Uterine Fundus: firm Incision: healing well, no significant drainage DVT Evaluation: No evidence of DVT seen on physical exam. Negative Homan's sign. No cords or calf tenderness. No significant calf/ankle edema. 2+DTRs.  Recent Labs    03/02/18 0945 03/03/18 0540  HGB 11.9* 10.4*  HCT 36.2 30.5*    Assessment/Plan: Status post Cesarean section. Doing well postoperatively.  One elevated BP this morning; others normal. Consider starting Norvasc or Lisinopril if continues to be high. No other severe features. Desires POPs for contraception. Continue current postpartum care. Possible discharge to home tomorrow if remains stable.  Jaynie CollinsUgonna Anyanwu, MD 03/04/2018, 7:30 AM

## 2018-03-04 NOTE — Progress Notes (Signed)
Patient ID: Sheila Cline, female   DOB: May 01, 1981, 37 y.o.   MRN: [id number]   Last 2 pressures above 140systolic, 90 diastolic.  Will begin Norvasc 10 qd.

## 2018-03-05 ENCOUNTER — Other Ambulatory Visit: Payer: Medicaid Other

## 2018-03-05 MED ORDER — LABETALOL HCL 200 MG PO TABS
200.0000 mg | ORAL_TABLET | Freq: Three times a day (TID) | ORAL | Status: DC
  Administered 2018-03-05 – 2018-03-06 (×4): 200 mg via ORAL
  Filled 2018-03-05 (×4): qty 1

## 2018-03-05 MED ORDER — LABETALOL HCL 200 MG PO TABS
200.0000 mg | ORAL_TABLET | Freq: Two times a day (BID) | ORAL | Status: DC
  Administered 2018-03-05: 200 mg via ORAL
  Filled 2018-03-05: qty 1

## 2018-03-05 NOTE — Lactation Note (Signed)
This note was copied from a baby's chart. Lactation Consultation Note  Patient Name: Sheila Cline HYQMV'HToday's Date: 03/05/2018  Mom is pumping every 3 hours and obtaining 20+ mls each time.  Instructed to change setting to standard setting on pump.  [facility] on pump for discharge.  Discussed pump loaner program.  Possible discharge tomorrow.   Maternal Data    Feeding    LATCH Score                   Interventions    Lactation Tools Discussed/Used     Consult Status      Huston FoleyMOULDEN, Tymber Stallings S 03/05/2018, 10:23 AM

## 2018-03-05 NOTE — Progress Notes (Signed)
Subjective: Postpartum Day 3: Repeat Cesarean Delivery at 31 2/7 weeks for NRFHT, [facility] sleeping. Doing well per nursing.    Objective: Vital signs in last 24 hours: Temp:  [97.8 F (36.6 C)-98.4 F (36.9 C)] 97.8 F (36.6 C) (04/01 1200) Pulse Rate:  [83-106] 84 (04/01 1200) Resp:  [17-18] 18 (04/01 1200) BP: (133-155)/(79-103) 146/101 (04/01 1200) SpO2:  [97 %-99 %] 98 % (04/01 1200)  Physical Exam:  Deferred  Recent Labs    03/03/18 0540  HGB 10.4*  HCT 30.5*    Assessment/Plan: Status post Cesarean section. Doing well postoperatively. Norvasc added yesterday for BP control. BP still elevated ( dystolic 100's) Labetalol added. Will continue to monitor BP. Hopefully will be able to discharge home tomorrow.    Hermina StaggersMichael L Shetara Launer 03/05/2018, 2:17 PM

## 2018-03-06 ENCOUNTER — Encounter (HOSPITAL_COMMUNITY): Payer: Self-pay | Admitting: Obstetrics and Gynecology

## 2018-03-06 MED ORDER — IBUPROFEN 600 MG PO TABS: 600.0000 mg | ORAL_TABLET | Freq: Four times a day (QID) | ORAL | 0 refills | Status: DC

## 2018-03-06 MED ORDER — OXYCODONE HCL 5 MG PO TABS: 5.0000 mg | ORAL_TABLET | ORAL | 0 refills | Status: DC | PRN

## 2018-03-06 MED ORDER — LABETALOL HCL 200 MG PO TABS: 200.0000 mg | ORAL_TABLET | Freq: Three times a day (TID) | ORAL | 1 refills | Status: DC

## 2018-03-06 MED ORDER — AMLODIPINE BESYLATE 10 MG PO TABS: 10.0000 mg | ORAL_TABLET | Freq: Every day | ORAL | 1 refills | Status: DC

## 2018-03-06 NOTE — Discharge Instructions (Signed)

## 2018-03-06 NOTE — Addendum Note (Signed)
Addendum  created 03/06/18 0741 by Phillips Groutarignan, Izik Bingman, MD   Intraprocedure Event deleted

## 2018-03-06 NOTE — Discharge Summary (Signed)
Obstetric Discharge Summary Reason for Admission: Preeclampsia Prenatal Procedures: NST and ultrasound Intrapartum Procedures: cesarean: low cervical, transverse Postpartum Procedures: magnesium Complications-Operative and Postpartum: none Hemoglobin  Date Value Ref Range Status  03/03/2018 10.4 (L) 12.0 - 15.0 g/dL Final  96/04/540903/11/2018 81.112.0 11.1 - 15.9 g/dL Final   HCT  Date Value Ref Range Status  03/03/2018 30.5 (L) 36.0 - 46.0 % Final   Hematocrit  Date Value Ref Range Status  02/13/2018 36.1 34.0 - 46.6 % Final   Hospital Course: Pt was admitted with preeclampsia. Received magnesium and BMZ. Daily antenatal testing. BP was controlled with medications. However had episodes of variables. Felt to be due to cord compression. On 03/02/18 how NRFHT's and pt was taken to OR for c section d/t to this finding and malpresentation. See OP note for additional information. Post op course was complicated by BP controlled which was finally accomplished with Norvasc and Labetalol.   She denied HA or blurry vision. Progressed to ambulating, voiding, + flatus, tolerating diet and good oral pain control. Breast pumping. Infant stable in NICU. On POD # 4 felt pt was amendable for discharge home. Discharge instructions and medications were reviewed with pt. She verbalized understanding.   Physical Exam:  Lungs clear  Heart RRR Abd soft + BS uterus firm, drsg intact, nl lochia Ext non tender 1 + edema no clonus DTR's + 2   Discharge Diagnoses: IUP 32 weeks, Severe PEC, NRFHT's and malpresentation  Discharge Information: Date: 03/06/2018 Activity: pelvic rest Diet: routine Medications: PNV, Ibuprofen, Percocet and Norvasc and Labetalol Condition: stable Instructions: refer to practice specific booklet Discharge to: home [facility] Information    [facility] Schedule an appointment as soon as possible for a visit in 1 week(s).   Why:  1 week for BP check and wound check 4 weeks for PP  visit Contact information: [address] [place] [id number] [phone]          Newborn Data: Live born female  Birth Weight: 3 lb 3.9 oz (1470 g) APGAR: 3, 8  Newborn Delivery   Birth date/time:  03/02/2018 12:07:00 Delivery type:  C-Section, Low Transverse C-section categorization:  Primary     Stable in NICU  Hermina StaggersMichael L Janica Eldred 03/06/2018, 1:48 PM

## 2018-03-08 ENCOUNTER — Ambulatory Visit (HOSPITAL_COMMUNITY): Payer: Medicaid Other

## 2018-03-08 ENCOUNTER — Encounter (HOSPITAL_COMMUNITY): Payer: Self-pay

## 2018-03-12 ENCOUNTER — Encounter: Payer: Medicaid Other | Admitting: Family Medicine

## 2018-03-12 ENCOUNTER — Other Ambulatory Visit: Payer: Medicaid Other

## 2018-03-13 ENCOUNTER — Encounter: Payer: Self-pay | Admitting: *Deleted

## 2018-03-13 ENCOUNTER — Ambulatory Visit (INDEPENDENT_AMBULATORY_CARE_PROVIDER_SITE_OTHER): Payer: Medicaid Other | Admitting: *Deleted

## 2018-03-13 VITALS — BP 123/88 | HR 83 | Wt 223.3 lb

## 2018-03-13 DIAGNOSIS — Z98891 History of uterine scar from previous surgery: Secondary | ICD-10-CM

## 2018-03-13 DIAGNOSIS — Z0130 Encounter for examination of blood pressure without abnormal findings: Secondary | ICD-10-CM

## 2018-03-13 NOTE — Progress Notes (Signed)
Patient presents to clinic for blood pressure and incision check, 1wk pp. She is taking amlodipine 10mg  daily and labetolol 200mg  tid. No c/o of headaches or visual disturbances, no pain in RUQ. Pedal edema still present, 2+ bilaterally. Pt stated she has been up and moving quite a bit, so I advised trying to elevate her feet to help with edema. BP wnl. Check of incision shows it is well approximated with no edema, drainage, minimal pain per patient.  Advised to continue her bp medication until seen at her pp visit May 2. Report any severe headaches, visual changes, or RUQ pain. Continue to care for incision by gentle washing with soap and water, keep dry. Report erythema, edema, drainage or fever. Patient voiced understanding of all instructions.

## 2018-03-14 ENCOUNTER — Other Ambulatory Visit: Payer: Self-pay | Admitting: Obstetrics and Gynecology

## 2018-03-15 NOTE — Progress Notes (Signed)
I have reviewed the chart and agree with nursing staff's documentation of this patient's encounter.  Laverda Stribling, MD 03/15/2018 4:14 PM    

## 2018-03-17 ENCOUNTER — Encounter: Payer: Self-pay | Admitting: Obstetrics and Gynecology

## 2018-03-19 ENCOUNTER — Other Ambulatory Visit: Payer: Medicaid Other

## 2018-03-22 ENCOUNTER — Encounter: Payer: Self-pay | Admitting: Obstetrics and Gynecology

## 2018-03-26 ENCOUNTER — Encounter: Payer: Medicaid Other | Admitting: Obstetrics and Gynecology

## 2018-03-26 ENCOUNTER — Other Ambulatory Visit: Payer: Medicaid Other

## 2018-03-28 ENCOUNTER — Other Ambulatory Visit: Payer: Self-pay | Admitting: Obstetrics and Gynecology

## 2018-03-29 ENCOUNTER — Other Ambulatory Visit: Payer: Self-pay | Admitting: Obstetrics

## 2018-04-02 ENCOUNTER — Other Ambulatory Visit: Payer: Medicaid Other

## 2018-04-09 ENCOUNTER — Other Ambulatory Visit: Payer: Medicaid Other

## 2018-04-11 ENCOUNTER — Encounter: Payer: Self-pay | Admitting: Obstetrics and Gynecology

## 2018-04-11 ENCOUNTER — Other Ambulatory Visit (HOSPITAL_COMMUNITY)
Admission: RE | Admit: 2018-04-11 | Discharge: 2018-04-11 | Disposition: A | Discharge status: Home / Self Care | Payer: Medicaid Other | Source: Ambulatory Visit | Attending: Obstetrics and Gynecology | Admitting: Obstetrics and Gynecology

## 2018-04-11 ENCOUNTER — Ambulatory Visit (INDEPENDENT_AMBULATORY_CARE_PROVIDER_SITE_OTHER): Payer: Medicaid Other | Admitting: Obstetrics and Gynecology

## 2018-04-11 VITALS — BP 133/95 | HR 85 | Wt 225.0 lb

## 2018-04-11 DIAGNOSIS — R87612 Low grade squamous intraepithelial lesion on cytologic smear of cervix (LGSIL): Secondary | ICD-10-CM | POA: Diagnosis present

## 2018-04-11 DIAGNOSIS — I82812 Embolism and thrombosis of superficial veins of left lower extremities: Secondary | ICD-10-CM

## 2018-04-11 DIAGNOSIS — Z1389 Encounter for screening for other disorder: Secondary | ICD-10-CM

## 2018-04-11 LAB — POCT PREGNANCY, URINE: Preg Test, Ur: NEGATIVE

## 2018-04-11 NOTE — Progress Notes (Addendum)
Subjective:     Sheila Cline is a 37 y.o. female who presents for a postpartum visit. She is 6 weeks postpartum following a low cervical transverse Cesarean section. I have fully reviewed the prenatal and intrapartum course. The delivery was at 31 gestational weeks due to non reassuring fetal status in the setting of severe preeclampsia. Outcome: repeat cesarean section, low transverse incision. Anesthesia: spinal. Postpartum course has been uncomplicated. Patient has stopped taking her blood pressure medication on her own one week ago. Endorses occasional headaches relieved with tylenol. Has pain in her left calf which she says is similar to when she had a blood clot. Baby's course has been unremarkable, remains in NICU at this time. Baby is feeding by bottle with EBM. Bleeding no bleeding. Bowel function is normal. Bladder function is normal. Patient is not sexually active. Contraception method is none. Postpartum depression screening: negative.  Review of Systems Pertinent items are noted in HPI.   Objective:    BP (!) 133/95 (BP Location: Right Arm, Patient Position: Sitting, Cuff Size: Large)   Pulse 85   Wt 242 lb 12.8 oz (110.1 kg)   LMP 07/26/2017   Breastfeeding? Yes Comment: pumping  BMI 47.42 kg/m   General:  alert and no distress   Breasts:  inspection negative, no nipple discharge or bleeding, no masses or nodularity palpable  Lungs: clear to auscultation bilaterally  Heart:  regular rate and rhythm  Abdomen: soft, non-tender; bowel sounds normal; no masses,  no organomegaly  Incision: healed well. No erythema, induration or drainage   Vulva:  normal  Vagina: normal vagina, no discharge, exudate, lesion, or erythema  Cervix:  multiparous appearance  Corpus: normal size, contour, position, consistency, mobility, non-tender  Adnexa:  no mass, fullness, tenderness  Rectal Exam: Not performed.        EXTREMITY: bilateral lower extremity equal in size, mild tenderness to left  calf, 5 cm below knee Assessment:     Normal postpartum exam. Pap smear not done at today's visit.   Plan:    1. Contraception: oral progesterone-only contraceptive 2. Patient is medically cleared to resume all activities of daily living. Patient to follow up with PCP for further evaluation of HTN   3. 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  Placed in lithotomy position. Cervix viewed with speculum and colposcope after application of acetic acid.   Colposcopy adequate?  yes Acetowhite lesions? Small lesion at 12 o'clock Punctation? no Mosaicism?  no Abnormal vasculature?  no Biopsies? Yes 12 o'clock ECC? yes  COMMENTS:  Patient was given post procedure instructions.  She will return in 2 weeks for results.  Catalina Antigua, MD   3. Follow up in:  as needed pending colposcopy results  4. Lower extremity doppler ordered to evaluate left lower calf pain.

## 2018-04-12 ENCOUNTER — Ambulatory Visit (INDEPENDENT_AMBULATORY_CARE_PROVIDER_SITE_OTHER): Payer: Medicaid Other | Admitting: Obstetrics and Gynecology

## 2018-04-12 ENCOUNTER — Ambulatory Visit (HOSPITAL_COMMUNITY)
Admission: RE | Admit: 2018-04-12 | Discharge: 2018-04-12 | Disposition: A | Discharge status: Home / Self Care | Payer: Medicaid Other | Source: Ambulatory Visit | Attending: Obstetrics and Gynecology | Admitting: Obstetrics and Gynecology

## 2018-04-12 ENCOUNTER — Encounter: Payer: Self-pay | Admitting: Obstetrics & Gynecology

## 2018-04-12 ENCOUNTER — Encounter: Payer: Self-pay | Admitting: Obstetrics and Gynecology

## 2018-04-12 VITALS — BP 126/74 | HR 90 | Ht 60.0 in | Wt 225.0 lb

## 2018-04-12 DIAGNOSIS — I82812 Embolism and thrombosis of superficial veins of left lower extremities: Secondary | ICD-10-CM

## 2018-04-12 DIAGNOSIS — I82402 Acute embolism and thrombosis of unspecified deep veins of left lower extremity: Secondary | ICD-10-CM | POA: Insufficient documentation

## 2018-04-12 DIAGNOSIS — M79662 Pain in left lower leg: Secondary | ICD-10-CM | POA: Diagnosis present

## 2018-04-12 DIAGNOSIS — Z86718 Personal history of other venous thrombosis and embolism: Secondary | ICD-10-CM | POA: Insufficient documentation

## 2018-04-12 DIAGNOSIS — I82442 Acute embolism and thrombosis of left tibial vein: Secondary | ICD-10-CM | POA: Diagnosis not present

## 2018-04-12 DIAGNOSIS — O871 Deep phlebothrombosis in the puerperium: Secondary | ICD-10-CM | POA: Diagnosis not present

## 2018-04-12 DIAGNOSIS — O2230 Deep phlebothrombosis in pregnancy, unspecified trimester: Secondary | ICD-10-CM

## 2018-04-12 DIAGNOSIS — I82432 Acute embolism and thrombosis of left popliteal vein: Secondary | ICD-10-CM | POA: Diagnosis not present

## 2018-04-12 HISTORY — DX: Deep phlebothrombosis in pregnancy, unspecified trimester: O22.30

## 2018-04-12 HISTORY — DX: Acute embolism and thrombosis of unspecified deep veins of left lower extremity: I82.402

## 2018-04-12 MED ORDER — ENOXAPARIN SODIUM 150 MG/ML ~~LOC~~ SOLN: 1.0000 mg/kg | Freq: Two times a day (BID) | SUBCUTANEOUS | 6 refills | Status: DC

## 2018-04-12 NOTE — Progress Notes (Signed)
Obstetrics and Gynecology Visit Return Patient Evaluation  Appointment Date: 04/12/2018  Primary Care Provider: Patient, No Pcp Per  Referring Provider: No ref. provider found  Chief Complaint: new LLE DVT  History of Present Illness:  Sheila Cline is a 37 y.o. G3P2 who was seen for her postpartum visit yesterday with Dr. Jolayne Panther. She was diagnosed with LLE DVT today at her doppler study which showed on the prelim: Left lower extremity venous duplex exam completed. Positive for deep veins and superficial vein's thrombosis, involving left popliteal vein, Gastrocnemius vein, posterior tibial vein and peroneal vein, and small saphenus vein at the region of popliteal fossa  She was added on to an appointment today d/w her re: next steps. Pt denies any tobacco usage and no recent long trips where she was immobile. She did have a superficial VTE which showed a "Superficial thrombosis noted in varicose veins of the left proximal calf/ knee area" on 09/26/2017. She was on lovenox 40 bid for 45 days. She had a c-section on 3/29 at 31wks for severe pre-eclampsia and bad fetal heart rate. It looks like she was on lovenox  qday while she was in the hospital but not discharged to home on anti-coagulation. She was prescribed conventional OCPs at her PP visit yesterday but I don't see it sent in and she states that the only medication she is on is motrin prn.   She denies any chest pain, sob, fevers, chills  Review of Systems:  as noted in the History of Present Illness.  Patient Active Problem List   Diagnosis Date Noted  . 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 Abnormal antenatal test 02/19/2018  . Preeclampsia, third trimester 02/15/2018  . Exposure to the flu 01/22/2018  . LGSIL on Pap smear of cervix 10/09/2017  . Superficial thrombosis of left lower extremity 10/09/2017  . Supervision of high risk pregnancy, antepartum 09/25/2017  . History of placenta  abruption leading to loss at 26 weeks 09/25/2017  . Pregnancy complicated by obesity 09/25/2017  . 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  Physical Exam:  BP 126/74   Pulse 90   Ht 5' (1.524 m)   Wt 225 lb (102.1 kg)   LMP 07/26/2017   BMI 43.94 kg/m  Body mass index is 43.94 kg/m. General appearance: Well nourished, well developed female in no acute distress.  Neuro/Psych:  Normal mood and affect.     Assessment: pt stable  Plan:  1. Postpartum deep vein thrombosis Pt states that she is breastfeeding. Will do lovenox /kg q12h and refer pt to heme. Will get baseline cbc and see her back in 1wk to repeat cbc. Birth control options d/w her and will do nexplanon in 1wk; pt with h/o it in the past and liked it. USMEC category 2. Pt states she hasn't had intercourse in months  ED precautions given.   - Ambulatory referral to Hematology - CBC   RTC: 1wk for cbc and nexplanon insertion.   Cornelia Copa MD Attending Center for [employer] [facility])

## 2018-04-12 NOTE — Progress Notes (Signed)
Here due to Diagnosis of +DVT- to discuss plan of care with provider.   Referrral to hematology made - 05/02/18 10:00

## 2018-04-12 NOTE — Progress Notes (Signed)
Preliminary notes--Left lower extremity venous duplex exam completed. Positive for deep veins and superficial vein's thrombosis, involving left popliteal vein, Gastrocnemius vein, posterior tibial vein and peroneal vein, and small saphenus vein at the region of popliteal fossa. Result called Dr. Herbert Seta and patient was instructed to go to the women's clinic directly to have treatment immediately.   Hongying Stacie Templin (RDMS RVT) 04/12/18 3:13 PM

## 2018-04-13 ENCOUNTER — Telehealth: Payer: Self-pay | Admitting: Hematology

## 2018-04-13 ENCOUNTER — Encounter: Payer: Self-pay | Admitting: Hematology

## 2018-04-13 LAB — CBC
Hematocrit: 37.2 % (ref 34.0–46.6)
Hemoglobin: 12.1 g/dL (ref 11.1–15.9)
MCH: 26.9 pg (ref 26.6–33.0)
MCHC: 32.5 g/dL (ref 31.5–35.7)
MCV: 83 fL (ref 79–97)
PLATELETS: 243 10*3/uL (ref 150–379)
RBC: 4.5 x10E6/uL (ref 3.77–5.28)
RDW: 14.4 % (ref 12.3–15.4)
WBC: 11 10*3/uL — ABNORMAL HIGH (ref 3.4–10.8)

## 2018-04-13 NOTE — Telephone Encounter (Signed)
Received a call from [facility] at Dr. Vergie Living to schedule a hematology appt for DVT. Pt has been scheduled to see Dr. Candise Che on 5/29 at 10am. Bonita Quin will notify the pt. Letter mailed.

## 2018-04-15 ENCOUNTER — Encounter: Payer: Self-pay | Admitting: Obstetrics and Gynecology

## 2018-04-16 ENCOUNTER — Other Ambulatory Visit: Payer: Medicaid Other

## 2018-04-17 ENCOUNTER — Ambulatory Visit: Payer: Self-pay

## 2018-04-17 NOTE — Lactation Note (Signed)
This note was copied from a baby's chart. Lactation Consultation Note  Patient Name: Sheila Cline [id number] Date: 04/17/2018 Reason for consult: Follow-up assessment;NICU baby;Infant < 6lbs  Requested to assist in NICU with positioning and latching baby to the breast.  Mom pre-pumped for 5 mins, obtaining 1 1/2 oz.  (Mom reports a great milk supply and pumping regularly) Baby refluxed through his nose while changing his diaper.  Nose suctioned as baby was very unset and struggling to clear his airway.  Mom placed baby on her chest with pacifier until he settled. Baby fussy when positioned and attempted to latch.  Mom's breasts full and compressible, nipples erect.  Baby unable to attain a deep latch to breast. Initiated a 20 mm nipple shield, showed Mom how to apply this to breast.  Nipple pulled nicely into shield.  Baby able to latch fairly well onto breast.  Assisted Mom to use alternate breast compression when baby sucking to increase milk transfer.  Swallows identified.   Mom instructed to limit baby to 30 mins on the breast for now, so not to overtire him. Encouraged Mom to finish double pumping, 15 mins after feeding over.  Feeding Feeding Type: Breast Fed Length of feed: 25 min  LATCH Score Latch: Repeated attempts needed to sustain latch, nipple held in mouth throughout feeding, stimulation needed to elicit sucking reflex.  Audible Swallowing: Spontaneous and intermittent  Type of Nipple: Everted at rest and after stimulation  Comfort (Breast/Nipple): Soft / non-tender  Hold (Positioning): Assistance needed to correctly position infant at breast and maintain latch.  LATCH Score: 8  Interventions Interventions: Breast feeding basics reviewed;Assisted with latch;Skin to skin;Breast massage;Hand express;Pre-pump if needed;Breast compression;Adjust position;Support pillows;Position options;Expressed milk;DEBP  Lactation Tools Discussed/Used Tools: Nipple Shields Nipple  shield size: 20 Breast pump type: Double-Electric Breast Pump   Consult Status Consult Status: Follow-up Date: 04/18/18 Follow-up type: Call as needed    Judee Clara 04/17/2018, 2:38 PM

## 2018-04-19 ENCOUNTER — Other Ambulatory Visit: Payer: Self-pay | Admitting: General Practice

## 2018-04-19 DIAGNOSIS — O871 Deep phlebothrombosis in the puerperium: Secondary | ICD-10-CM

## 2018-04-23 ENCOUNTER — Other Ambulatory Visit: Payer: Medicaid Other

## 2018-04-23 ENCOUNTER — Telehealth: Payer: Self-pay | Admitting: Hematology

## 2018-04-23 NOTE — Telephone Encounter (Signed)
Patient called to cancel °

## 2018-04-25 ENCOUNTER — Encounter: Payer: Self-pay | Admitting: *Deleted

## 2018-04-26 ENCOUNTER — Encounter: Payer: Self-pay | Admitting: Obstetrics and Gynecology

## 2018-04-26 ENCOUNTER — Ambulatory Visit (INDEPENDENT_AMBULATORY_CARE_PROVIDER_SITE_OTHER): Payer: Medicaid Other | Admitting: Obstetrics and Gynecology

## 2018-04-26 ENCOUNTER — Other Ambulatory Visit: Payer: Self-pay

## 2018-04-26 VITALS — BP 124/80 | HR 79 | Wt 226.0 lb

## 2018-04-26 DIAGNOSIS — Z3046 Encounter for surveillance of implantable subdermal contraceptive: Secondary | ICD-10-CM | POA: Diagnosis present

## 2018-04-26 DIAGNOSIS — Z3202 Encounter for pregnancy test, result negative: Secondary | ICD-10-CM

## 2018-04-26 DIAGNOSIS — Z308 Encounter for other contraceptive management: Secondary | ICD-10-CM

## 2018-04-26 DIAGNOSIS — Z30017 Encounter for initial prescription of implantable subdermal contraceptive: Secondary | ICD-10-CM

## 2018-04-26 LAB — POCT URINE PREGNANCY: Preg Test, Ur: NEGATIVE

## 2018-04-26 NOTE — Procedures (Signed)
Nexplanon Insertion Procedure Note Prior to the procedure being performed, the patient (or guardian) was asked to state their full name, date of birth, type of procedure being performed and the exact location of the operative site. This information was then checked against the documentation in the patient's chart. Prior to the procedure being performed, a "time out" was performed by the physician that confirmed the correct patient, procedure and site.  After informed consent was obtained, the patient's non-dominant left arm was chosen for insertion. The old site was examined and was approximately 8 cm proximal to the medial epicondyle in the sulcus between the biceps and triceps on the inner surface. The area was cleaned with alcohol then local anesthesia was infiltrated with 3 ml of 1% lidocaine w/ epi along the planned insertion track. The area was prepped with betadine. Using sterile technique the Nexplanon device was inserted per manufacturer's guidelines in the subdermal connective tissue using the standard insertion technique without difficulty. Pressure was applied and the insertion site was hemostatic. The presence of the Nexplanon was confirmed immediately after insertion by palpation by both me and the patient and by checking the tip of needle for the absence of the insert.  A pressure dressing was applied.  The patient tolerated the procedure well.  Patient told to consider effective in 3 years. She is doing well with the lovenox inj and leg feels better. Heme appointment in place.   Cornelia Copa MD Attending Center for [employer] [facility])

## 2018-05-01 ENCOUNTER — Other Ambulatory Visit: Payer: Medicaid Other

## 2018-05-01 ENCOUNTER — Encounter: Payer: Self-pay | Admitting: *Deleted

## 2018-05-02 ENCOUNTER — Encounter: Payer: Medicaid Other | Admitting: Hematology

## 2018-05-04 ENCOUNTER — Other Ambulatory Visit: Payer: Self-pay | Admitting: Obstetrics and Gynecology

## 2018-05-06 ENCOUNTER — Encounter: Payer: Self-pay | Admitting: Obstetrics and Gynecology

## 2018-05-10 ENCOUNTER — Encounter (HOSPITAL_COMMUNITY): Payer: Self-pay | Admitting: Internal Medicine

## 2018-05-10 ENCOUNTER — Ambulatory Visit (HOSPITAL_COMMUNITY): Payer: Medicaid Other | Admitting: Hematology

## 2018-05-10 ENCOUNTER — Other Ambulatory Visit: Payer: Self-pay

## 2018-05-10 ENCOUNTER — Inpatient Hospital Stay (HOSPITAL_COMMUNITY): Payer: Medicaid Other | Attending: Hematology | Admitting: Internal Medicine

## 2018-05-10 ENCOUNTER — Encounter: Payer: Self-pay | Admitting: *Deleted

## 2018-05-10 ENCOUNTER — Inpatient Hospital Stay (HOSPITAL_COMMUNITY): Payer: Medicaid Other

## 2018-05-10 VITALS — BP 113/86 | HR 90 | Temp 98.3°F | Resp 16 | Wt 224.0 lb

## 2018-05-10 DIAGNOSIS — I82402 Acute embolism and thrombosis of unspecified deep veins of left lower extremity: Secondary | ICD-10-CM | POA: Insufficient documentation

## 2018-05-10 LAB — COMPREHENSIVE METABOLIC PANEL
ALK PHOS: 84 U/L (ref 38–126)
ALT: 61 U/L — AB (ref 14–54)
AST: 28 U/L (ref 15–41)
Albumin: 4.2 g/dL (ref 3.5–5.0)
Anion gap: 7 (ref 5–15)
BUN: 11 mg/dL (ref 6–20)
CALCIUM: 9.3 mg/dL (ref 8.9–10.3)
CO2: 25 mmol/L (ref 22–32)
CREATININE: 0.68 mg/dL (ref 0.44–1.00)
Chloride: 106 mmol/L (ref 101–111)
GFR calc Af Amer: >60 mL/min (ref >60)
GFR calc non Af Amer: >60 mL/min (ref >60)
Glucose, Bld: 100 mg/dL — ABNORMAL HIGH (ref 65–99)
Potassium: 3.9 mmol/L (ref 3.5–5.1)
SODIUM: 138 mmol/L (ref 135–145)
Total Bilirubin: 0.5 mg/dL (ref 0.3–1.2)
Total Protein: 7.3 g/dL (ref 6.5–8.1)

## 2018-05-10 LAB — CBC WITH DIFFERENTIAL/PLATELET
BASOS ABS: 0 10*3/uL (ref 0.0–0.1)
BASOS PCT: 0 %
EOS ABS: 0.1 10*3/uL (ref 0.0–0.7)
Eosinophils Relative: 2 %
HCT: 39.1 % (ref 36.0–46.0)
HEMOGLOBIN: 12.6 g/dL (ref 12.0–15.0)
LYMPHS ABS: 2.1 10*3/uL (ref 0.7–4.0)
Lymphocytes Relative: 28 %
MCH: 26.9 pg (ref 26.0–34.0)
MCHC: 32.2 g/dL (ref 30.0–36.0)
MCV: 83.5 fL (ref 78.0–100.0)
Monocytes Absolute: 0.5 10*3/uL (ref 0.1–1.0)
Monocytes Relative: 6 %
NEUTROS PCT: 64 %
Neutro Abs: 4.9 10*3/uL (ref 1.7–7.7)
Platelets: 213 10*3/uL (ref 150–400)
RBC: 4.68 MIL/uL (ref 3.87–5.11)
RDW: 14 % (ref 11.5–15.5)
WBC: 7.6 10*3/uL (ref 4.0–10.5)

## 2018-05-10 LAB — LACTATE DEHYDROGENASE: LDH: 149 U/L (ref 98–192)

## 2018-05-10 LAB — PROTIME-INR
INR: 0.98
Prothrombin Time: 12.9 seconds (ref 11.4–15.2)

## 2018-05-10 LAB — FERRITIN: Ferritin: 14 ng/mL (ref 11–307)

## 2018-05-10 LAB — APTT: aPTT: 36 seconds (ref 24–36)

## 2018-05-10 MED ORDER — WARFARIN SODIUM 5 MG PO TABS: 5.0000 mg | ORAL_TABLET | Freq: Every day | ORAL | 1 refills | Status: DC

## 2018-05-10 MED ORDER — ENOXAPARIN SODIUM 150 MG/ML ~~LOC~~ SOLN: 150.0000 mg | SUBCUTANEOUS | 2 refills | Status: DC

## 2018-05-10 NOTE — Patient Instructions (Signed)
[facility] at [facility] Discharge Instructions   You were seen today by Dr. Ahmed PrimaVetta Higgs. She is going to change your Lovenox to 150 mg/ml  Syringe daily, She is also going to start you on Coumadin 5 mg daily. We will see you back in 1 week for lab work. We will Follow up in 2 weeks for labs and follow up.     Thank you for choosing [facility] at [facility] to provide your oncology and hematology care.  To afford each patient quality time with our provider, please arrive at least 15 minutes before your scheduled appointment time.    If you have a lab appointment with the Cancer Center please come in thru the  Main Entrance and check in at the main information desk  You need to re-schedule your appointment should you arrive 10 or more minutes late.  We strive to give you quality time with our providers, and arriving late affects you and other patients whose appointments are after yours.  Also, if you no show three or more times for appointments you may be dismissed from the clinic at the providers discretion.     Again, thank you for choosing [facility].  Our hope is that these requests will decrease the amount of time that you wait before being seen by our physicians.       _____________________________________________________________  Should you have questions after your visit to [facility], please contact our office at [phone] between the hours of 8:30 a.m. and 4:30 p.m.  Voicemails left after 4:30 p.m. will not be returned until the following business day.  For prescription refill requests, have your pharmacy contact our office.       Resources For Cancer Patients and their Caregivers ? American Cancer Society: Can assist with transportation, wigs, general needs, runs Look Good Feel Better.        [phone] ? Cancer Care: Provides financial assistance, online support groups, medication/co-pay  assistance.  1-800-813-HOPE 986-508-8236(4673) ? [facility] Assists [place] cancer patients and their families through emotional , educational and financial support.  (580)587-2002(706)816-4243 ? [place] DSS Where to apply for food stamps, Medicaid and utility assistance. 506-632-4434908-047-0237 ? [facility]: Transportation to medical appointments. 865 102 [phone] ? Social Security Administration: May apply for disability if have a Stage IV cancer. (850)082-2714(573)308-7759 [phone] ? [place] [employer] [employer] and [facility]: Assists with nutrition, care and transit needs. 6400985839(812) [phone]  Cancer Center Support Programs:   > Cancer Support Group  2nd Tuesday of the month 1pm-2pm, Journey Room   > Creative Journey  3rd Tuesday of the month 1130am-1pm, Journey Room

## 2018-05-10 NOTE — Progress Notes (Signed)
Referring physician:  Dr. Elly Modena  Diagnosis Deep venous embolism and thrombosis of left lower extremity ([place]) - Plan: CBC with Differential/Platelet, Comprehensive metabolic panel, Lactate dehydrogenase, Ferritin, Factor 5 leiden, Protime-INR, APTT, Lupus anticoagulant panel, ANA, IFA (with reflex), Beta-2-glycoprotein i abs, IgG/M/A, Protime-INR  Staging Cancer Staging No matching staging information was found for the patient.  Assessment and Plan:  1.  Left lower extremity DVT.  37 year old female who is status post C-section on March 02, 2018.  She was also diagnosed with preeclampsia.  She is followed by Dr. Elly Modena.  She reports she has some leg discomfort and has a history of varicose veins and underwent a LLE Doppler September 26, 2017 that showed no DVT but superficial thrombophlebitis.  On 04/12/2018 she reported some worsening leg pain and underwent a left lower extremity Doppler that showed IMPRESSION acute DVT in the Popliteal vein, Posterior Tibial veins, Peroneal veins, and Gastrocnemius vein. There is evidence of acute superficial thrombosis of the small saphenous vein.  She was started on Lovenox.  She has also undergone Nexplanon placement  for contraception.  She denies any prior miscarriages.  She has a 37 year old and reported one child was lost at 26 weeks due to placental abruption.  She reports leg symptoms improved with Lovenox.    She has concerns about the contraception used due to her history of blood clots and what she has seen on the Internet.  She denies any history of blood clots.  She is a non-smoker.  She denies any bleeding.  Patient is here today for consultation due to DVT that occurred in postpartum period.    Long talk held with the patient today.  I discussed with her she will be recommended for ongoing anticoagulation for at least 3 to 6 months.  At 3 months she would undergo repeat Doppler imaging.  She is currently on Lovenox.  She was given option of once  daily Lovenox dosing for convenience.  She was also given option of transition to warfarin.  She is currently breast-feeding so DOACS would not be currently recommended due to lack of safety and efficacy data.  She will continue Lovenox to overlap with Coumadin and will return to clinic next week for repeat INR check.  She will undergo hypercoagulable evaluation.  She is given a prescription for Lovenox 150 mg once a day to be used with Coumadin 5 mg a day.  She is advised to notify the office if she has any bleeding prior to her visit.  She will also return to clinic in 2 weeks to go over the results of her lab studies.  She is given written information regarding coumadin as well as diet recommendations. All questions answered and she expressed understanding of the information presented.    2.  Questions regarding contraception.  She is on Nexplanon.  I have contacted Dr. Elly Modena and discussed with her patient was expressing some concerns based on what she has seen on the Internet.  Her office will contact the patient to discuss.  3.  Pregnancy loss.  This occurred at 26 weeks due to placental abruption.  She denies any miscarriage history.  HPI: 37 year old female who is status post C-section on March 02, 2018.  She was also diagnosed with preeclampsia.  She is followed by Dr. Elly Modena.  She reports she has some leg discomfort and has a history of varicose veins and underwent a Doppler September 26, 2017 that showed no DVT but superficial thrombophlebitis.  On 04/12/2018 she reported  some worsening leg pain and underwent a left lower extremity Doppler that showed IMPRESSION acute DVT in the Popliteal vein, Posterior Tibial veins, Peroneal veins, and Gastrocnemius vein. There is evidence of acute superficial thrombosis of the small saphenous vein.  She was started on Lovenox.  She has also undergone Nexplanon placement  for contraception.  She denies any prior miscarriages.  She has a 37 year old and  reported one child was lost at 26 weeks due to placental abruption.  She has concerns about the contraception used due to her history of blood clots and what she has seen on the Internet.  She denies any history of blood clots.  She is a non-smoker.  She denies any bleeding.  Patient is here today for consultation due to DVT that occurred in postpartum period.     Problem List Patient Active Problem List   Diagnosis Date Noted  . Deep venous embolism and thrombosis of left lower extremity ([place]) [I82.402] 04/12/2018  . LGSIL on Pap smear of cervix [R87.612] 10/09/2017  . Superficial thrombosis of left lower extremity [I82.812] 10/09/2017    Past Medical History Past Medical History:  Diagnosis Date  . Chlamydia   . Chronic interstitial cystitis 02/25/2013  . Deep vein thrombosis (DVT) during pregnancy at 31 weeks ([place]) 04/12/2018  . Deep venous embolism and thrombosis of left lower extremity ([place]) 04/12/2018  . Dysplasia of cervix, low grade (CIN 1)   . History of placenta abruption leading to loss at 26 weeks 09/25/2017   LAC, anticardiolipin neg; beta 2 glycoprotein neg  . HPV (human papilloma virus) infection   . HSV-2 (herpes simplex virus 2) infection   . Interstitial cystitis   . OAB (overactive bladder) 09/24/2013  . Stress at home 09/24/2013  . Superficial thrombosis of left lower extremity 09/2017    Past Surgical History Past Surgical History:  Procedure Laterality Date  . CESAREAN SECTION    . CESAREAN SECTION N/A 03/02/2018   Procedure: CESAREAN SECTION;  Surgeon: Mora Bellman, MD;  Location: [place];  Service: Obstetrics;  Laterality: N/A;  . tubes in ears      Family History Family History  Problem Relation Age of Onset  . Interstitial cystitis Maternal Grandmother   . Stroke Maternal Grandmother   . Heart disease Maternal Grandmother   . Cancer Maternal Aunt        breast  . Hypertension Mother   . Asthma Mother   . Asthma Brother   . Asthma  Daughter   . Hypertension Maternal Grandfather      Social History  reports that she has never smoked. She has never used smokeless tobacco. She reports that she does not drink alcohol or use drugs.  Medications  Current Outpatient Medications:  .  ibuprofen (ADVIL,MOTRIN) 600 MG tablet, TAKE 1 TABLET BY MOUTH EVERY 6 HOURS, Disp: 30 tablet, Rfl: 5 .  Prenatal MV-Min-FA-Omega-3 (PRENATAL GUMMIES/DHA & FA) 0.4-32.5 MG CHEW, Chew 2 each by mouth daily., Disp: , Rfl:  .  enoxaparin (LOVENOX) 150 MG/ML injection, Inject 1 mL (150 mg total) into the skin daily., Disp: 30 Syringe, Rfl: 2 .  warfarin (COUMADIN) 5 MG tablet, Take 1 tablet (5 mg total) by mouth daily., Disp: 30 tablet, Rfl: 1  Allergies Patient has no known allergies.  Review of Systems Review of Systems - Oncology ROS as per HPI otherwise 12 point ROS is negative.   Physical Exam  Vitals Wt Readings from Last 3 Encounters:  05/10/18 224 lb (101.6 kg)  04/26/18 226 lb (102.5 kg)  04/12/18 225 lb (102.1 kg)   Temp Readings from Last 3 Encounters:  05/10/18 98.3 F (36.8 C) (Oral)  03/06/18 98.5 F (36.9 C) (Oral)  02/08/18 98.1 F (36.7 C) (Oral)   BP Readings from Last 3 Encounters:  05/10/18 113/86  04/26/18 124/80  04/12/18 126/74   Pulse Readings from Last 3 Encounters:  05/10/18 90  04/26/18 79  04/12/18 90   Constitutional: Well-developed, well-nourished, and in no distress.   HENT: Head: Normocephalic and atraumatic.  Mouth/Throat: No oropharyngeal exudate. Mucosa moist. Eyes: Pupils are equal, round, and reactive to light. Conjunctivae are normal. No scleral icterus.  Neck: Normal range of motion. Neck supple. No JVD present.  Cardiovascular: Normal rate, regular rhythm and normal heart sounds.  Exam reveals no gallop and no friction rub.   No murmur heard. Pulmonary/Chest: Effort normal and breath sounds normal. No respiratory distress. No wheezes.No rales.  Abdominal: Soft. Bowel sounds are  normal. No distension. There is no tenderness. There is no guarding.  Musculoskeletal: No edema or tenderness.  Lymphadenopathy: No cervical, axillary or supraclavicular adenopathy.  Neurological: Alert and oriented to person, place, and time. No cranial nerve deficit.  Skin: Skin is warm and dry. No rash noted. No erythema. No pallor.  Psychiatric: Affect and judgment normal.   Labs Appointment on 05/10/2018  Component Date Value Ref Range Status  . WBC 05/10/2018 7.6  4.0 - 10.5 K/uL Final  . RBC 05/10/2018 4.68  3.87 - 5.11 MIL/uL Final  . Hemoglobin 05/10/2018 12.6  12.0 - 15.0 g/dL Final  . HCT 05/10/2018 39.1  36.0 - 46.0 % Final  . MCV 05/10/2018 83.5  78.0 - 100.0 fL Final  . MCH 05/10/2018 26.9  26.0 - 34.0 pg Final  . MCHC 05/10/2018 32.2  30.0 - 36.0 g/dL Final  . RDW 05/10/2018 14.0  11.5 - 15.5 % Final  . Platelets 05/10/2018 213  150 - 400 K/uL Final  . Neutrophils Relative % 05/10/2018 64  % Final  . Neutro Abs 05/10/2018 4.9  1.7 - 7.7 K/uL Final  . Lymphocytes Relative 05/10/2018 28  % Final  . Lymphs Abs 05/10/2018 2.1  0.7 - 4.0 K/uL Final  . Monocytes Relative 05/10/2018 6  % Final  . Monocytes Absolute 05/10/2018 0.5  0.1 - 1.0 K/uL Final  . Eosinophils Relative 05/10/2018 2  % Final  . Eosinophils Absolute 05/10/2018 0.1  0.0 - 0.7 K/uL Final  . Basophils Relative 05/10/2018 0  % Final  . Basophils Absolute 05/10/2018 0.0  0.0 - 0.1 K/uL Final   Performed at [facility], [address], [place], [place] [address]  . Sodium 05/10/2018 138  135 - 145 mmol/L Final  . Potassium 05/10/2018 3.9  3.5 - 5.1 mmol/L Final  . Chloride 05/10/2018 106  101 - 111 mmol/L Final  . CO2 05/10/2018 25  22 - 32 mmol/L Final  . Glucose, Bld 05/10/2018 100* 65 - 99 mg/dL Final  . BUN 05/10/2018 11  6 - 20 mg/dL Final  . Creatinine, Ser 05/10/2018 0.68  0.44 - 1.00 mg/dL Final  . Calcium 05/10/2018 9.3  8.9 - 10.3 mg/dL Final  . Total Protein 05/10/2018 7.3  6.5 - 8.1 g/dL Final   . Albumin 05/10/2018 4.2  3.5 - 5.0 g/dL Final  . AST 05/10/2018 28  15 - 41 U/L Final  . ALT 05/10/2018 61* 14 - 54 U/L Final  . Alkaline Phosphatase 05/10/2018 84  38 -  126 U/L Final  . Total Bilirubin 05/10/2018 0.5  0.3 - 1.2 mg/dL Final  . GFR calc non Af Amer 05/10/2018 >60  >60 mL/min Final  . GFR calc Af Amer 05/10/2018 >60  >60 mL/min Final   Comment: (NOTE) The eGFR has been calculated using the CKD EPI equation. This calculation has not been validated in all clinical situations. eGFR's persistently <60 mL/min signify possible Chronic Kidney Disease.   Georgiann Hahn gap 05/10/2018 7  5 - 15 Final   Performed at [facility], [address], [place], [place] [address]  . LDH 05/10/2018 149  98 - 192 U/L Final   Performed at [facility], [address], [place], [place] [address]  . Prothrombin Time 05/10/2018 12.9  11.4 - 15.2 seconds Final  . INR 05/10/2018 0.98   Final   Performed at [facility], [address], [place], [place] [address]  . aPTT 05/10/2018 36  24 - 36 seconds Final   Performed at [facility], [address], [place], [place] [address]     Pathology Orders Placed This Encounter  Procedures  . CBC with Differential/Platelet    Standing Status:   Future    Number of Occurrences:   1    Standing Expiration Date:   05/11/2019  . Comprehensive metabolic panel    Standing Status:   Future    Number of Occurrences:   1    Standing Expiration Date:   05/11/2019  . Lactate dehydrogenase    Standing Status:   Future    Number of Occurrences:   1    Standing Expiration Date:   05/11/2019  . Ferritin    Standing Status:   Future    Number of Occurrences:   1    Standing Expiration Date:   05/11/2019  . Factor 5 leiden    Standing Status:   Future    Number of Occurrences:   1    Standing Expiration Date:   05/10/2019  . Protime-INR    Standing Status:   Future    Number of Occurrences:   1    Standing Expiration Date:   05/10/2019  . APTT    Standing Status:    Future    Number of Occurrences:   1    Standing Expiration Date:   05/10/2019  . Lupus anticoagulant panel    Standing Status:   Future    Number of Occurrences:   1    Standing Expiration Date:   05/10/2019  . ANA, IFA (with reflex)  . Beta-2-glycoprotein i abs, IgG/M/A    Standing Status:   Future    Number of Occurrences:   1    Standing Expiration Date:   05/10/2019  . Protime-INR    Standing Status:   Future    Standing Expiration Date:   05/11/2019       Zoila Shutter MD

## 2018-05-11 LAB — LUPUS ANTICOAGULANT PANEL
DRVVT: 43.6 s (ref 0.0–47.0)
PTT Lupus Anticoagulant: 44.9 s (ref 0.0–51.9)

## 2018-05-11 LAB — BETA-2-GLYCOPROTEIN I ABS, IGG/M/A: Beta-2 Glyco I IgG: <9 GPI IgG units (ref 0–20)

## 2018-05-11 LAB — ANTINUCLEAR ANTIBODIES, IFA: ANTINUCLEAR ANTIBODIES, IFA: NEGATIVE

## 2018-05-16 ENCOUNTER — Other Ambulatory Visit (HOSPITAL_COMMUNITY): Payer: Self-pay | Admitting: Internal Medicine

## 2018-05-16 ENCOUNTER — Inpatient Hospital Stay (HOSPITAL_COMMUNITY): Payer: Medicaid Other

## 2018-05-16 ENCOUNTER — Ambulatory Visit (HOSPITAL_COMMUNITY)
Admission: RE | Admit: 2018-05-16 | Discharge: 2018-05-16 | Disposition: A | Discharge status: Home / Self Care | Payer: Medicaid Other | Source: Ambulatory Visit | Attending: Internal Medicine | Admitting: Internal Medicine

## 2018-05-16 DIAGNOSIS — I82402 Acute embolism and thrombosis of unspecified deep veins of left lower extremity: Secondary | ICD-10-CM

## 2018-05-16 LAB — CBC WITH DIFFERENTIAL/PLATELET
BASOS ABS: 0 10*3/uL (ref 0.0–0.1)
BASOS PCT: 0 %
Eosinophils Absolute: 0.1 10*3/uL (ref 0.0–0.7)
Eosinophils Relative: 1 %
HEMATOCRIT: 42.2 % (ref 36.0–46.0)
Hemoglobin: 13.3 g/dL (ref 12.0–15.0)
Lymphocytes Relative: 22 %
Lymphs Abs: 2.2 10*3/uL (ref 0.7–4.0)
MCH: 26.3 pg (ref 26.0–34.0)
MCHC: 31.5 g/dL (ref 30.0–36.0)
MCV: 83.6 fL (ref 78.0–100.0)
MONO ABS: 0.7 10*3/uL (ref 0.1–1.0)
Monocytes Relative: 7 %
NEUTROS ABS: 7.1 10*3/uL (ref 1.7–7.7)
Neutrophils Relative %: 70 %
PLATELETS: 216 10*3/uL (ref 150–400)
RBC: 5.05 MIL/uL (ref 3.87–5.11)
RDW: 14.1 % (ref 11.5–15.5)
WBC: 10.1 10*3/uL (ref 4.0–10.5)

## 2018-05-16 LAB — FACTOR 5 LEIDEN

## 2018-05-16 LAB — PROTIME-INR
INR: 1.15
Prothrombin Time: 14.6 seconds (ref 11.4–15.2)

## 2018-05-17 ENCOUNTER — Encounter: Payer: Self-pay | Admitting: Obstetrics and Gynecology

## 2018-05-17 ENCOUNTER — Ambulatory Visit: Payer: Self-pay | Admitting: Advanced Practice Midwife

## 2018-05-17 ENCOUNTER — Encounter: Payer: Self-pay | Admitting: General Practice

## 2018-05-17 ENCOUNTER — Other Ambulatory Visit (HOSPITAL_COMMUNITY): Payer: Medicaid Other

## 2018-05-21 ENCOUNTER — Other Ambulatory Visit (HOSPITAL_COMMUNITY): Payer: Medicaid Other

## 2018-05-21 ENCOUNTER — Ambulatory Visit: Payer: Self-pay | Admitting: Obstetrics and Gynecology

## 2018-05-24 ENCOUNTER — Ambulatory Visit (HOSPITAL_COMMUNITY): Payer: Medicaid Other | Admitting: Internal Medicine

## 2018-06-06 ENCOUNTER — Ambulatory Visit (INDEPENDENT_AMBULATORY_CARE_PROVIDER_SITE_OTHER): Payer: Medicaid Other | Admitting: Obstetrics and Gynecology

## 2018-06-06 ENCOUNTER — Encounter: Payer: Self-pay | Admitting: Obstetrics and Gynecology

## 2018-06-06 VITALS — BP 132/87 | HR 94 | Wt 224.3 lb

## 2018-06-06 DIAGNOSIS — Z3009 Encounter for other general counseling and advice on contraception: Secondary | ICD-10-CM

## 2018-06-06 DIAGNOSIS — I825Z2 Chronic embolism and thrombosis of unspecified deep veins of left distal lower extremity: Secondary | ICD-10-CM

## 2018-06-06 NOTE — Progress Notes (Signed)
37 yo [id number] here to discuss Nexplanon. Patient was diagnosed with DVT end of May and started on Lovenox. Following initiation of coumadin, she developed left upper extremity swelling. The swelling resolved after discontinuation of the coumadin. She is without any complaints. 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 Her husband is planning a vasectomy. Patient desires to keep Nexplanon for now  Past Medical History:  Diagnosis Date  . Chlamydia   . Chronic interstitial cystitis 02/25/2013  . Deep vein thrombosis (DVT) during pregnancy at 31 weeks ([facility]) 04/12/2018  . 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 Dysplasia of cervix, low grade (CIN 1)   . History of placenta abruption leading to loss at 26 weeks 09/25/2017   LAC, anticardiolipin neg; beta 2 glycoprotein neg  . HPV (human papilloma virus) infection   . HSV-2 (herpes simplex virus 2) infection   . Interstitial cystitis   . OAB (overactive bladder) 09/24/2013  . Stress at home 09/24/2013  . Superficial thrombosis of left lower extremity 09/2017   Past Surgical History:  Procedure Laterality Date  . CESAREAN SECTION    . CESAREAN SECTION N/A 03/02/2018   Procedure: CESAREAN SECTION;  Surgeon: Catalina Antiguaonstant, Hosanna Betley, MD;  [facility]: [facility];  Service: Obstetrics;  Laterality: N/A;  . tubes in ears     Family History  Problem Relation Age of Onset  . Interstitial cystitis Maternal Grandmother   . Stroke Maternal Grandmother   . Heart disease Maternal Grandmother   . Cancer Maternal Aunt        breast  . Hypertension Mother   . Asthma Mother   . Asthma Brother   . Asthma Daughter   . Hypertension Maternal Grandfather    ROS See pertinent in HPI  Blood pressure 132/87, pulse 94, weight 224 lb 4.8 oz (101.7 kg), currently breastfeeding. GENERAL: Well-developed, well-nourished female in no acute distress.  EXTREMITIES: No cyanosis, clubbing, or edema, 2+  pulses.  A/P 37 yo with postpartum DVT here to discuss contraception - Patient desires to keep Nexplanon which at this time is not a contraindication Informed patient that irregular bleeding may occur while on blood thinner - She desires to transfer care to a [place] hematologist - echo ordered to evaluate left arm swelling

## 2018-06-14 ENCOUNTER — Ambulatory Visit (HOSPITAL_COMMUNITY): Payer: Medicaid Other | Admitting: Internal Medicine

## 2018-06-15 ENCOUNTER — Encounter: Payer: Self-pay | Admitting: Obstetrics and Gynecology

## 2018-06-15 ENCOUNTER — Other Ambulatory Visit: Payer: Self-pay | Admitting: General Practice

## 2018-06-15 DIAGNOSIS — M7989 Other specified soft tissue disorders: Secondary | ICD-10-CM

## 2018-06-15 DIAGNOSIS — D6851 Activated protein C resistance: Secondary | ICD-10-CM

## 2018-06-15 DIAGNOSIS — I82402 Acute embolism and thrombosis of unspecified deep veins of left lower extremity: Secondary | ICD-10-CM

## 2018-06-18 ENCOUNTER — Encounter: Payer: Self-pay | Admitting: Hematology

## 2018-06-18 ENCOUNTER — Telehealth: Payer: Self-pay | Admitting: Hematology

## 2018-06-18 NOTE — Telephone Encounter (Signed)
New hem appt has been scheduled for the pt to see Dr. Candise CheKale for DVT/Factor V Leiden. This is a referral from Dr. Jolayne Pantheronstant. Pt aware to arrive 30 minutes early. Letter mailed.

## 2018-07-02 ENCOUNTER — Encounter: Payer: Self-pay | Admitting: Obstetrics and Gynecology

## 2018-07-02 ENCOUNTER — Encounter (HOSPITAL_COMMUNITY): Payer: Self-pay | Admitting: *Deleted

## 2018-07-06 ENCOUNTER — Other Ambulatory Visit: Payer: Self-pay

## 2018-07-06 ENCOUNTER — Ambulatory Visit (HOSPITAL_COMMUNITY): Payer: Self-pay | Attending: Cardiovascular Disease

## 2018-07-06 DIAGNOSIS — I82402 Acute embolism and thrombosis of unspecified deep veins of left lower extremity: Secondary | ICD-10-CM | POA: Insufficient documentation

## 2018-07-06 DIAGNOSIS — D6851 Activated protein C resistance: Secondary | ICD-10-CM | POA: Insufficient documentation

## 2018-07-06 DIAGNOSIS — M7989 Other specified soft tissue disorders: Secondary | ICD-10-CM | POA: Insufficient documentation

## 2018-07-06 NOTE — Progress Notes (Signed)
HEMATOLOGY/ONCOLOGY CONSULTATION NOTE  Date of Service: 07/09/2018  Patient Care Team: Patient, No Pcp Per as PCP - General (General Practice)  CHIEF COMPLAINTS/PURPOSE OF CONSULTATION:  History of DVT and Factor V Leiden  HISTORY OF PRESENTING ILLNESS:   Sheila Cline is a wonderful 37 y.o. female who has been referred to us by OBGYN Dr. Jolayne Pantheronstant for evaluation and management of History of DVT and Factor V Leiden. She is accompanied today by her mother and baby. The pt reports that she is doing well overall.   In October 2018, at 3 months gestation, the pt had a Left leg superficial venous thrombosis and took Lovenox treatment dose for 45 days and aspirin throughout pregnancy. She developed preeclampsia at 29 weeks and then had an emergency Cessarean section at 31 weeks on 03/02/18. She then had Nexplanon placed in her left arm sometime in April, and had been planning to keep this for 3 years, not having realized it was an additional risk factor with her clotting history. She then developed left leg swelling and pain again in May 2019. The subsequent US revealed a DVT and SVT, as noted below. She was discharged with Coumadin, and soon switching to Lovenox a couple days later.  The pt was worked up on 04/12/18 with an US for left leg pain that revealed an acute DVT in the popliteal vein, posterior tibial veins, peroneal veins, and gastrocnemius vein as well as evidence of an acute superficial thrombosis of the small saphenous vein.   She is planning to breast feed until March 2020.   She was on PO birth control from [age] y/o without blood clots. She then took Implanon for about seven years after her first pregnancy.  The pt has had three pregnancies, with one 37 year old and one child lost at 26 weeks to placental abruption. Regarding her placental abruption she received plasma and did not have the placenta evaluated. The pt reports that she developed varicose veins after her first delivery.    Most recent lab results (05/16/18) of CBC w/diff is as follows: all values are WNL except. Factor V Leiden 05/16/18 revealed Heterozygous R506Q mutation.  Ferritin 05/10/18 was low at 14  On review of systems, pt reports good energy levels, varicose veins, stable weight, and denies leg pain, leg swelling, chest pain, difficulty breathing, SOB, and any other symptoms.   On PMHx the pt reports interstitial cystitis, DVT, and denies other blood clots.  On Family Hx the pt denies any blood clots.   MEDICAL HISTORY:  Past Medical History:  Diagnosis Date  . Chlamydia   . Chronic interstitial cystitis 02/25/2013  . Deep vein thrombosis (DVT) during pregnancy at 31 weeks (HCC) 04/12/2018  . 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 Dysplasia of cervix, low grade (CIN 1)   . History of placenta abruption leading to loss at 26 weeks 09/25/2017   LAC, anticardiolipin neg; beta 2 glycoprotein neg  . HPV (human papilloma virus) infection   . HSV-2 (herpes simplex virus 2) infection   . Interstitial cystitis   . OAB (overactive bladder) 09/24/2013  . Stress at home 09/24/2013  . Superficial thrombosis of left lower extremity 09/2017    SURGICAL HISTORY: Past Surgical History:  Procedure Laterality Date  . CESAREAN SECTION    . CESAREAN SECTION N/A 03/02/2018   Procedure: CESAREAN SECTION;  Surgeon: Catalina Antiguaonstant, Peggy, MD;  Location: [facility];  Service: Obstetrics;  Laterality: N/A;  .  tubes in ears      SOCIAL HISTORY: Social History   Socioeconomic History  . Marital status: Married    Spouse name: Not on file  . Number of children: Not on file  . Years of education: Not on file  . Highest education level: Not on file  Occupational History  . Not on file  Social Needs  . Financial resource strain: Not on file  . Food insecurity:    Worry: Not on file    Inability: Not on file  . Transportation needs:    Medical: Not on file     Non-medical: Not on file  Tobacco Use  . Smoking status: Never Smoker  . Smokeless tobacco: Never Used  Substance and Sexual Activity  . Alcohol use: No    Comment: occ wine; not now  . Drug use: No  . Sexual activity: Yes    Birth control/protection: None  Lifestyle  . Physical activity:    Days per week: Not on file    Minutes per session: Not on file  . Stress: Not on file  Relationships  . Social connections:    Talks on phone: Not on file    Gets together: Not on file    Attends religious service: Not on file    Active member of club or organization: Not on file    Attends meetings of clubs or organizations: Not on file    Relationship status: Not on file  . Intimate partner violence:    Fear of current or ex partner: Not on file    Emotionally abused: Not on file    Physically abused: Not on file    Forced sexual activity: Not on file  Other Topics Concern  . Not on file  Social History Narrative  . Not on file    FAMILY HISTORY: Family History  Problem Relation Age of Onset  . Interstitial cystitis Maternal Grandmother   . Stroke Maternal Grandmother   . Heart disease Maternal Grandmother   . Cancer Maternal Aunt        breast  . Hypertension Mother   . Asthma Mother   . Asthma Brother   . Asthma Daughter   . Hypertension Maternal Grandfather     ALLERGIES:  has No Known Allergies.  MEDICATIONS:  Current Outpatient Medications  Medication Sig Dispense Refill  . enoxaparin (LOVENOX) 150 MG/ML injection Inject 1 mL (150 mg total) into the skin daily. 30 Syringe 2  . Prenatal MV-Min-FA-Omega-3 (PRENATAL GUMMIES/DHA & FA) 0.4-32.5 MG CHEW Chew 2 each by mouth daily.    . iron polysaccharides (NIFEREX) 150 MG capsule Take 1 capsule (150 mg total) by mouth daily. 30 capsule 3   No current facility-administered medications for this visit.     REVIEW OF SYSTEMS:    10 Point review of Systems was done is negative except as noted above.  PHYSICAL  EXAMINATION:  . Vitals:   07/09/18 1008  BP: 117/79  Pulse: 85  Resp: 18  Temp: 98.4 F (36.9 C)  SpO2: 99%   Filed Weights   07/09/18 1008  Weight: 223 lb 6.4 oz (101.3 kg)   .Body mass index is 43.63 kg/m.  GENERAL:alert, in no acute distress and comfortable SKIN: no acute rashes, no significant lesions EYES: conjunctiva are pink and non-injected, sclera anicteric OROPHARYNX: MMM, no exudates, no oropharyngeal erythema or ulceration NECK: supple, no JVD LYMPH:  no palpable lymphadenopathy in the cervical, axillary or inguinal regions LUNGS: clear to auscultation  b/l with normal respiratory effort HEART: regular rate & rhythm ABDOMEN:  normoactive bowel sounds , non tender, not distended. Extremity: no pedal edema PSYCH: alert & oriented x 3 with fluent speech NEURO: no focal motor/sensory deficits  LABORATORY DATA:  I have reviewed the data as listed  . CBC Latest Ref Rng & Units 05/16/2018 05/10/2018 04/12/2018  WBC 4.0 - 10.5 K/uL 10.1 7.6 11.0(H)  Hemoglobin 12.0 - 15.0 g/dL 16.1 09.6 04.5  Hematocrit 36.0 - 46.0 % 42.2 39.1 37.2  Platelets 150 - 400 K/uL 216 213 243    . CMP Latest Ref Rng & Units 05/10/2018 03/02/2018 02/22/2018  Glucose 65 - 99 mg/dL 409(W) - 83  BUN 6 - 20 mg/dL 11 - 11(B)  Creatinine 0.44 - 1.00 mg/dL 1.47 8.29 5.62  Sodium 135 - 145 mmol/L 138 - 136  Potassium 3.5 - 5.1 mmol/L 3.9 - 4.5  Chloride 101 - 111 mmol/L 106 - 107  CO2 22 - 32 mmol/L 25 - 21(L)  Calcium 8.9 - 10.3 mg/dL 9.3 - 9.1  Total Protein 6.5 - 8.1 g/dL 7.3 - 5.0(L)  Total Bilirubin 0.3 - 1.2 mg/dL 0.5 - 0.5  Alkaline Phos 38 - 126 U/L 84 - 108  AST 15 - 41 U/L 28 - 22  ALT 14 - 54 U/L 61(H) - 19   Component     Latest Ref Rng & Units 05/10/2018  PTT Lupus Anticoagulant     0.0 - 51.9 sec 44.9  DRVVT     0.0 - 47.0 sec 43.6  Lupus Anticoag Interp      Comment:  Beta-2 Glycoprotein I Ab, IgG     0 - 20 GPI IgG units <9  Beta-2-Glycoprotein I IgM     0 - 32 GPI IgM  units <9  Beta-2-Glycoprotein I IgA     0 - 25 GPI IgA units <9  Ferritin     11 - 307 ng/mL 14    Contains abnormal data Factor 5 leiden  Order: [id number]  Status:  Final result Visible to patient:  Yes (MyChart) Next appt:  08/01/2018 at 02:35 PM in Obstetrics and Gynecology Conan Bowens, MD) Dx:  Deep venous embolism and thrombosis o...  Component 49mo ago  Recommendations-F5LEID: CommentAbnormal    Comment: (NOTE)  RESULT: SINGLE R506Q MUTATION IDENTIFIED (HETEROZYGOTE)         RADIOGRAPHIC STUDIES: I have personally reviewed the radiological images as listed and agreed with the findings in the report.  [place] ext venous 04/12/2018:  Final Interpretation: Right: No evidence of common femoral vein obstruction. Left: There is evidence of acute DVT in the Popliteal vein, Posterior Tibial veins, Peroneal veins, and Gastrocnemius vein. There is evidence of acute superficial thrombosis of the small saphenous vein.   ASSESSMENT & PLAN:   37 y.o. female with  1. Acute DVT in the Popliteal vein, Posterior Tibial veins, Peroneal veins, and Gastrocnemius vein. There is evidence of acute superficial thrombosis of the small saphenous vein. (04/12/2018) Related to pregnancy/post partum state + hormone exposure 2. Factor V Leiden - heterozygous mutation 3. Iron deficiency -  PLAN: -Discussed patient's most recent labs from 05/16/18, CBC w/diff showed all values WNL. Ferritin was low at 14.  -Factor V Leiden mutation on 05/16/18 revealed Heterozygous R506Q mutation -Low risk Acute DVT+ SVT   below the left knee on 04/12/18 [place] -Multiple provoking factors had been present without blood clot development from age [age], including the pt taking PO contraceptives for 7 years,  Implanon for 7 years, and two pregnancies -Pt developed SVT in October 2018, her first blood clot with her third pregnancy. -Discussed the noted concerns for increasing blood clot risk with Nexplanon, pt decided she will have her  Nexplanon removed to minimize further risks of blood clots -Has completed three months of Lovenox thus far -Recommend pt finish three additional months of Lovenox and continue 81mg  aspirin thereafter for 6 months. -patient is breast feeding so lovenox would be the appropriate anticoagulant for her to avoid infant exposure to anticoagulant. -No recommendation for life long blood thinners at this time - given her event was provoked. -Avoid hormone replacement and will need to consider alternative contraception with OB-Gyn. . Stay well hydrated on long trips especially and wear compression socks.  -discussed th -Recommend optimizing iron replacement, adding 150mg  Iron Polysaccharide -Dr. Jolayne Panther as OBGYN -Stressed the importance of establishing care with a PCP -Will see pt back in 3 months    RTC with Dr Candise Che with labs in 3 months   All of the patients questions were answered with apparent satisfaction. The patient knows to call the clinic with any problems, questions or concerns.  The total time spent in the appt was 50 minutes and more than 50% was on counseling and direct patient cares.     Wyvonnia Lora MD MS AAHIVMS [facility] Hematology/Oncology Physician [facility]  (Office):       [phone] (Work cell):  [phone] (Fax):           [phone]  07/09/2018 11:17 AM  I, Marcelline Mates, am acting as a scribe for Dr. Candise Che  .I have reviewed the above documentation for accuracy and completeness, and I agree with the above. Johney Maine MD

## 2018-07-09 ENCOUNTER — Telehealth: Payer: Self-pay

## 2018-07-09 ENCOUNTER — Encounter: Payer: Self-pay | Admitting: Hematology

## 2018-07-09 ENCOUNTER — Inpatient Hospital Stay: Payer: Self-pay | Attending: Hematology | Admitting: Hematology

## 2018-07-09 VITALS — BP 117/79 | HR 85 | Temp 98.4°F | Resp 18 | Ht 60.0 in | Wt 223.4 lb

## 2018-07-09 DIAGNOSIS — D509 Iron deficiency anemia, unspecified: Secondary | ICD-10-CM

## 2018-07-09 DIAGNOSIS — D6851 Activated protein C resistance: Secondary | ICD-10-CM | POA: Insufficient documentation

## 2018-07-09 DIAGNOSIS — O99112 Other diseases of the blood and blood-forming organs and certain disorders involving the immune mechanism complicating pregnancy, second trimester: Secondary | ICD-10-CM | POA: Insufficient documentation

## 2018-07-09 DIAGNOSIS — I82402 Acute embolism and thrombosis of unspecified deep veins of left lower extremity: Secondary | ICD-10-CM

## 2018-07-09 DIAGNOSIS — E611 Iron deficiency: Secondary | ICD-10-CM | POA: Insufficient documentation

## 2018-07-09 DIAGNOSIS — Z86718 Personal history of other venous thrombosis and embolism: Secondary | ICD-10-CM | POA: Insufficient documentation

## 2018-07-09 MED ORDER — ENOXAPARIN SODIUM 150 MG/ML ~~LOC~~ SOLN: 150.0000 mg | SUBCUTANEOUS | 2 refills | Status: DC

## 2018-07-09 MED ORDER — POLYSACCHARIDE IRON COMPLEX 150 MG PO CAPS: 150.0000 mg | ORAL_CAPSULE | Freq: Every day | ORAL | 3 refills | Status: AC

## 2018-07-09 NOTE — Telephone Encounter (Signed)
Printed avs and calender of upcoming appointment. Per 8/5 los 

## 2018-07-10 ENCOUNTER — Other Ambulatory Visit (HOSPITAL_COMMUNITY): Payer: Medicaid Other

## 2018-08-01 ENCOUNTER — Encounter: Payer: Self-pay | Admitting: Obstetrics and Gynecology

## 2018-08-01 ENCOUNTER — Ambulatory Visit (INDEPENDENT_AMBULATORY_CARE_PROVIDER_SITE_OTHER): Payer: Medicaid Other | Admitting: Obstetrics and Gynecology

## 2018-08-01 VITALS — BP 117/81 | HR 79 | Ht 60.0 in | Wt 224.0 lb

## 2018-08-01 DIAGNOSIS — Z3046 Encounter for surveillance of implantable subdermal contraceptive: Secondary | ICD-10-CM

## 2018-08-01 NOTE — Progress Notes (Signed)
    GYNECOLOGY OFFICE PROCEDURE NOTE  Sheila Cline is a 37 y.o. [id number] here for Nexplanon removal. She had it placed 04/2018 after delivery in 02/2018. She has extensive history of acute DVT in the setting of pregnancy as well as post partum, currently on lovenox as she is breastfeeding. Her Hematologist recommended switching contraception. I reviewed risks of contraception with her and that per CDC guidelines, the nexplanon, LngIUD and Cu-IUD all fall under category 2 of risk. Reviewed unless there was specific risk of nexplanon being in arm, would not consider her lower risk with removal of nexplanon and placement of IUD. Had extensive conversation regarding risks and benefits, risks of another pregnancy. She and husband plan for him to have a vasectomy but this cannot be done for a few months. After extended discussion, including with her mother, patient opts to have nexplanon removed, will not have IUD placed at this time. She is worried about clots with contraception in the setting of going off her lovenox once her pregnancy medicaid ends. I reviewed that with no contraception, she is at high risk of pregnancy, she verbalizes understanding and plans to use condoms.   Reviewed risks of removal of implant including risk of infection, bleeding, damage to surrounding tissues and organs, inability to remove in office. She verbalizes understanding and affirms desire to proceed. Consent signed.   Nexplanon Removal Patient identified, informed consent performed, consent signed. An adequate timeout was performed. Nexplanon site identified.  Area prepped in usual sterile fashon. 2 mLs of 1% lidocaine was used to anesthetize the area at the distal end of the implant. A small stab incision was made right beside the implant on the distal portion.  The Nexplanon rod was grasped using a hemostat and removed without difficulty intact.  There was minimal blood loss. There were no complications.  Steri-strips were  applied over the small incision.  A pressure bandage was applied to reduce any bruising.  The patient tolerated the procedure well and was given post procedure instructions.  Patient is planning to use condoms/vasectomy for contraception.  She will call if she desires to have another form of contraception placed.   Baldemar LenisK. Meryl Davis, M.D. [facility]

## 2018-10-04 ENCOUNTER — Telehealth: Payer: Self-pay | Admitting: Hematology

## 2018-10-04 NOTE — Telephone Encounter (Signed)
Called pt re appts being moved due to GK being on PAL. Pt wanted to move it out to next year due to not having any insurance.

## 2018-10-09 ENCOUNTER — Ambulatory Visit: Payer: Self-pay | Admitting: Hematology

## 2018-10-09 ENCOUNTER — Other Ambulatory Visit: Payer: Self-pay

## 2019-02-01 ENCOUNTER — Encounter: Payer: Self-pay | Admitting: Hematology

## 2019-02-01 NOTE — Progress Notes (Signed)
Patient left voicemail regarding whether or not her visit will be covered by her Medicaid and inability to pay.  Called patient back and advised she would need to contact her caseworker at [employer] to confirm the types of services her particular Medicaid will cover, however it will be filed to the [facility] on file. It there is a balance that and the service is not covered and she is considered a self-pay, she will automatically receive a 56% discount for services billed through [facility] and may possibly be eligible to apply for an additional discount. She verbalized understanding. Encouraged her again to reach out to her worker at her earliest convenience.  She originally wanted to cancel her appointment but states she would keep it.  Verbally advised Melissa X in scheduling to keep her on schedule for Monday 02/04/19. She confirmed she would be.  Patient has my contact name and number for any additional financial questions or concerns.

## 2019-02-04 ENCOUNTER — Inpatient Hospital Stay: Payer: Medicaid Other | Admitting: Hematology

## 2019-02-04 ENCOUNTER — Inpatient Hospital Stay: Payer: Medicaid Other

## 2019-02-05 ENCOUNTER — Telehealth: Payer: Self-pay | Admitting: *Deleted

## 2019-02-05 NOTE — Telephone Encounter (Signed)
Received faxed Telephone Advice fax from [facility] - patient cancelled lab/MD appts on 3/2 and asked to reschedule. Message sent to scheduling.

## 2019-07-15 ENCOUNTER — Other Ambulatory Visit: Payer: Self-pay

## 2019-07-15 DIAGNOSIS — Z20822 Contact with and (suspected) exposure to covid-19: Secondary | ICD-10-CM

## 2019-07-16 LAB — NOVEL CORONAVIRUS, NAA: SARS-CoV-2, NAA: DETECTED — AB

## 2019-07-24 ENCOUNTER — Other Ambulatory Visit: Payer: Self-pay

## 2019-07-24 DIAGNOSIS — Z20822 Contact with and (suspected) exposure to covid-19: Secondary | ICD-10-CM

## 2019-07-25 LAB — NOVEL CORONAVIRUS, NAA: SARS-CoV-2, NAA: NOT DETECTED

## 2019-09-06 ENCOUNTER — Other Ambulatory Visit: Payer: Self-pay | Admitting: *Deleted

## 2019-09-06 DIAGNOSIS — Z20822 Contact with and (suspected) exposure to covid-19: Secondary | ICD-10-CM

## 2019-09-08 LAB — NOVEL CORONAVIRUS, NAA: SARS-CoV-2, NAA: NOT DETECTED

## 2020-01-16 IMAGING — US US MFM FETAL BPP W/O NON-STRESS
1 series · 13 of 28 positions shown · non-contrast
Comparison: none

[Series 1: us mfm fetal bpp w/o non-stress · 38 acquisitions, 13 frames shown]
[im 2/38]
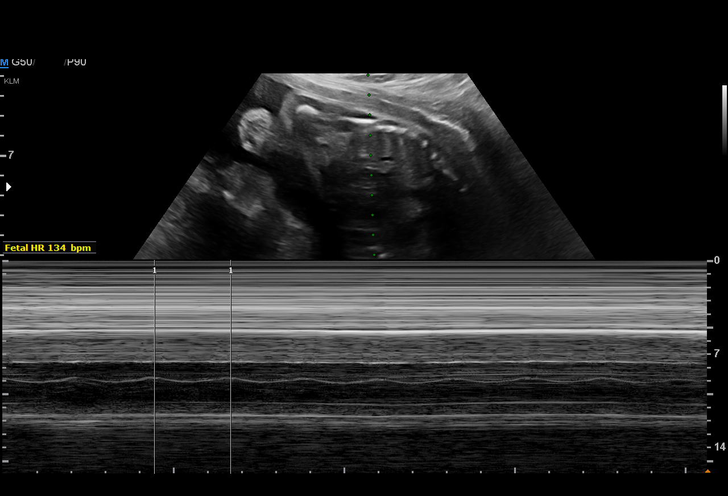
[im 5/38]
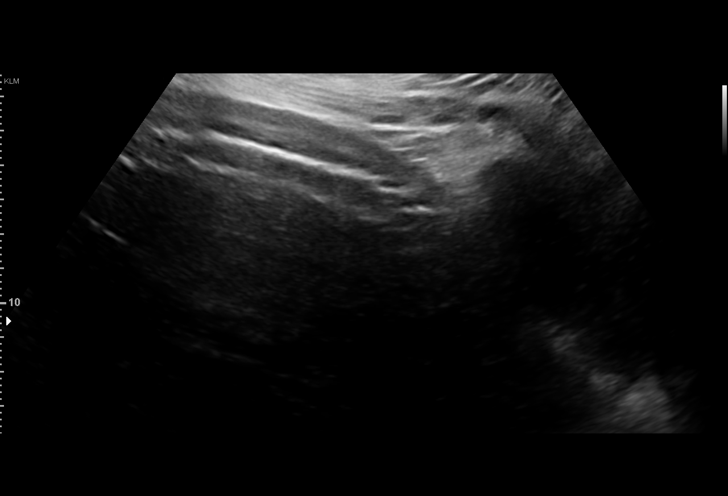
[im 7/38]
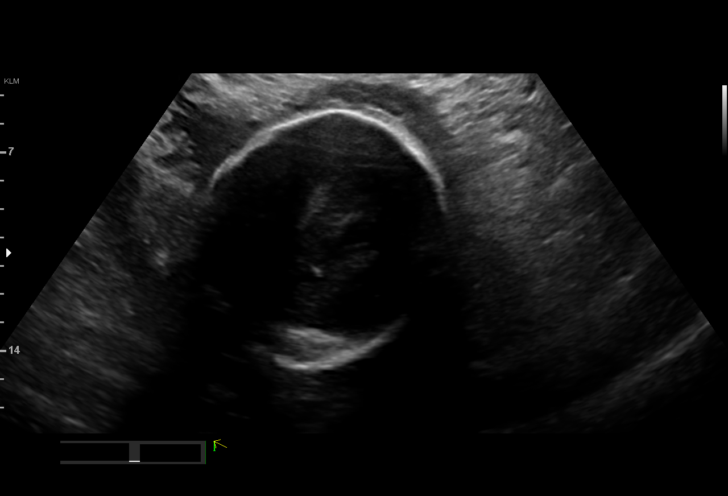
[im 10/38]
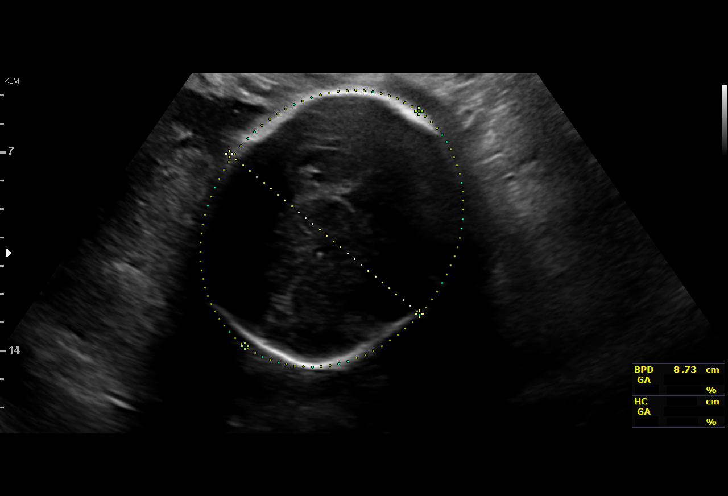
[im 13/38]
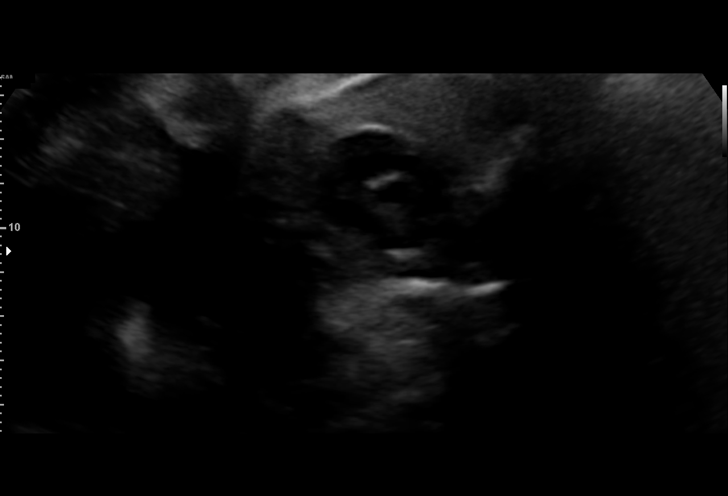
[im 16/38]
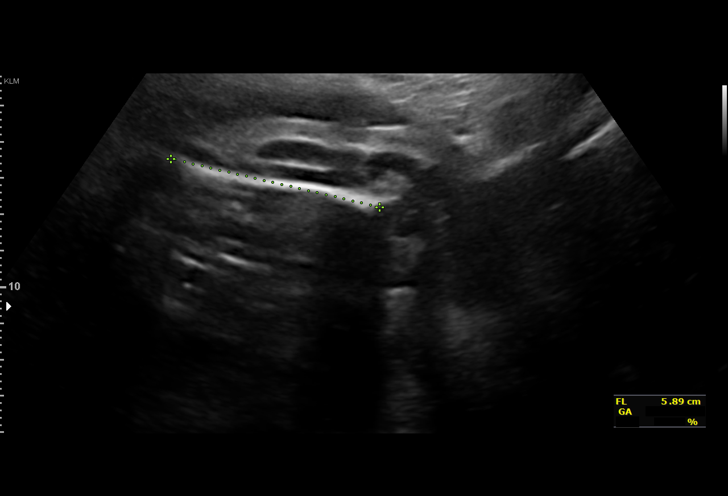
[im 20/38]
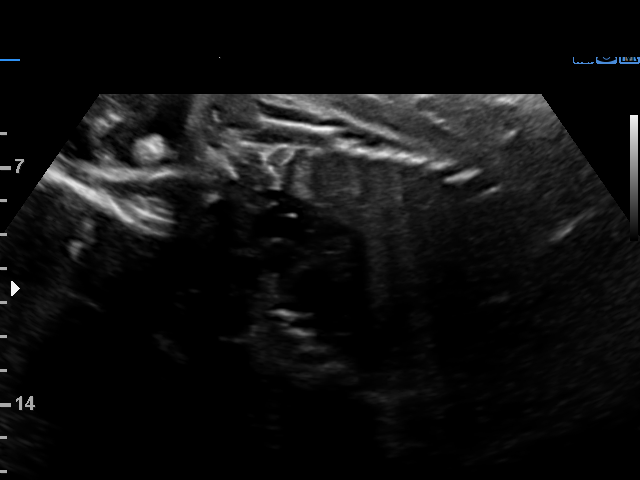
[im 22/38]
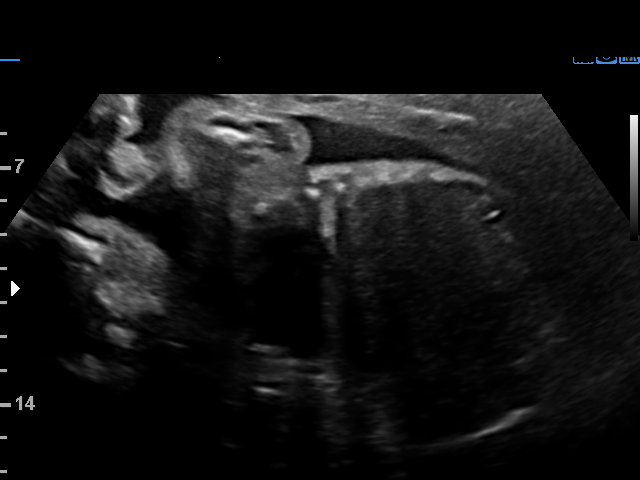
[im 25/38]
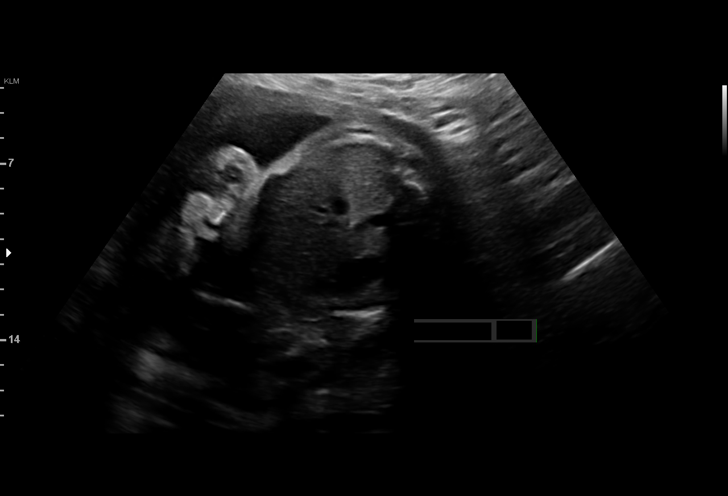
[im 28/38]
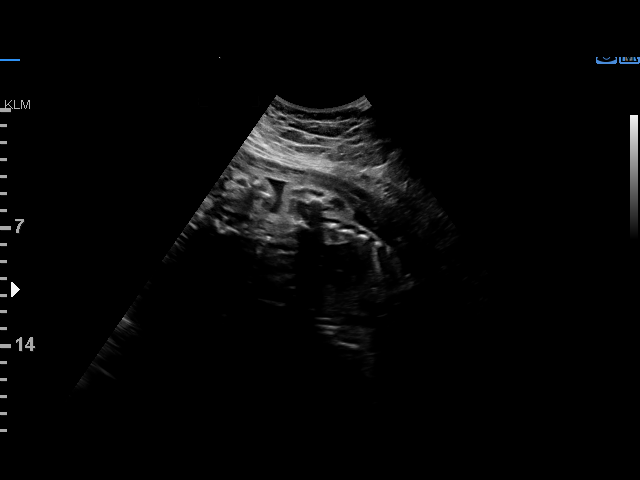
[im 31/38]
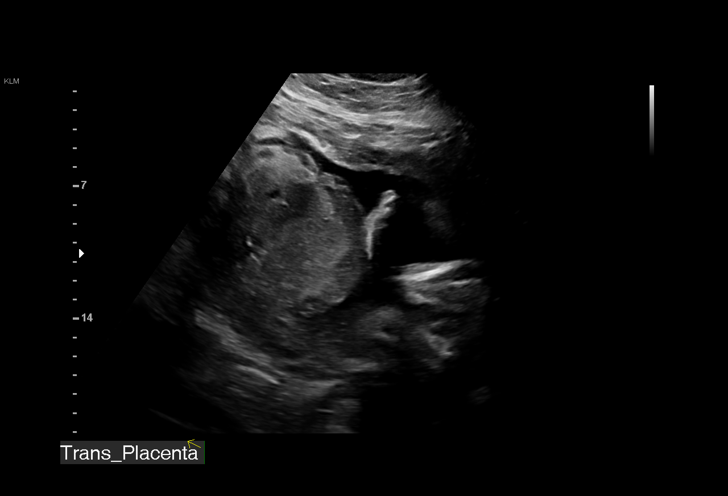
[im 33/38]
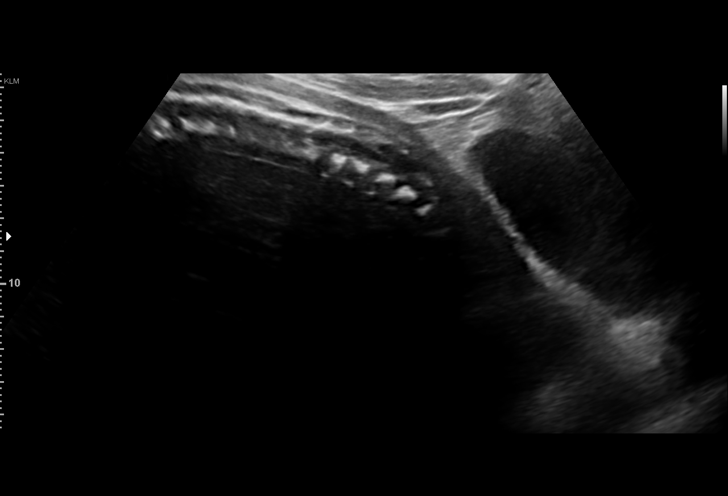
[im 36/38]
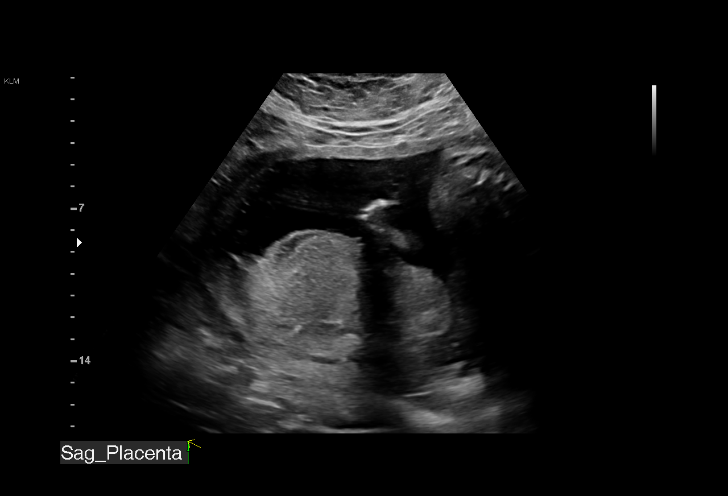

[13 of 28 positions shown; findings below may reference images not displayed]

OB/Gyn Clinic
Attending:        Jaleko Ridoy         Secondary Phy.:   3rd Nursing- HR
OB

1  MOO KNUDSEN             [id number]      [id number]     [id number]
2  MOO KNUDSEN             [id number]      [phone]     [id number]
Indications

31 weeks gestation of pregnancy
Non-reactive NST, FHR decelerations
Poor obstetric history: Previous IUFD
(stillbirth) - abruption
History of cesarean delivery, currently
pregnant
Advanced maternal age multigravida 35+,
third trimester (low risk NIPS)
Obesity complicating pregnancy, third
trimester
Pre-eclampsia
OB History

Blood Type:            Height:  5'0"   Weight (lb):  222       BMI:
Gravidity:    3         Term:   1        Prem:   1        SAB:   0
TOP:          0       Ectopic:  0        Living: 1
Fetal Evaluation

Num Of Fetuses:     1
Fetal Heart         131
Rate(bpm):
Cardiac Activity:   Observed
Presentation:       Frank breech
Placenta:           Posterior, above cervical os
P. Cord Insertion:  Previously Visualized

Amniotic Fluid
AFI FV:      Subjectively within normal limits

Largest Pocket(cm)
4.5

Comment:    [DATE] BPP in 13 minutes.
Biophysical Evaluation

Amniotic F.V:   Within normal limits       F. Tone:        Observed
F. Movement:    Observed                   Score:          [DATE]
F. Breathing:   Observed
Biometry

BPD:      85.3  mm     G. Age:  34w 2d         99  %    CI:        83.97   %    70 - 86
FL/HC:      20.3   %    19.3 -
HC:      293.4  mm     G. Age:  32w 3d         48  %    HC/AC:      1.10        0.96 -
AC:      265.7  mm     G. Age:  30w 5d         34  %    FL/BPD:     70.0   %    71 - 87
FL:       59.7  mm     G. Age:  31w 1d         35  %    FL/AC:      22.5   %    20 - 24

Est. FW:    3555  gm    3 lb 13 oz      58  %
Gestational Age

LMP:           31w 1d        Date:  07/26/17                 EDD:   05/02/18
U/S Today:     32w 1d                                        EDD:   04/25/18
Best:          31w 1d     Det. By:  LMP  (07/26/17)          EDD:   05/02/18
Anatomy

Cranium:               Appears normal         Aortic Arch:            Previously seen
Cavum:                 Previously seen        Ductal Arch:            Previously seen
Ventricles:            Previously seen        Diaphragm:              Previously seen
Choroid Plexus:        Previously seen        Stomach:                Appears normal, left
sided
Cerebellum:            Previously seen        Abdomen:                Appears normal
Posterior Fossa:       Previously seen        Abdominal Wall:         Previously seen
Nuchal Fold:           Previously seen        Cord Vessels:           Previously seen
Face:                  Orbits and profile     Kidneys:                Appear normal
previously seen
Lips:                  Previously seen        Bladder:                Appears normal
Thoracic:              Appears normal         Spine:                  Previously seen
Heart:                 Previously seen        Upper Extremities:      Previously seen
RVOT:                  Previously seen        Lower Extremities:      Previously seen
LVOT:                  Previously seen

Other:  Male gender. Heels and 5th digit prev. visualized. Nasal bone prev
visualized.
Cervix Uterus Adnexa

Cervix
Not visualized (advanced GA >22wks)
Impression

Singleton intrauterine pregnancy at 31+1 weeks with AMA,
PIH and FHR decelerations here for
Interval review of the anatomy shows no sonographic
markers for aneuploidy or structural anomalies
All relevant fetal anatomy has been visualized
Amniotic fluid volume is normal
Estimated fetal weight shows growth in the 58th percentile
BPP [DATE]
Recommendations

BPP is not a useful antenatal assessment of fetal well being if
decelerations are present
Continue clinical evaluation and management.

## 2022-02-24 ENCOUNTER — Ambulatory Visit
Admission: EM | Admit: 2022-02-24 | Discharge: 2022-02-24 | Disposition: A | Discharge status: Home / Self Care | Payer: Medicaid Other | Attending: Urgent Care | Admitting: Urgent Care

## 2022-02-24 ENCOUNTER — Encounter: Payer: Self-pay | Admitting: Emergency Medicine

## 2022-02-24 ENCOUNTER — Other Ambulatory Visit: Payer: Self-pay

## 2022-02-24 ENCOUNTER — Ambulatory Visit (INDEPENDENT_AMBULATORY_CARE_PROVIDER_SITE_OTHER): Payer: Medicaid Other

## 2022-02-24 DIAGNOSIS — R052 Subacute cough: Secondary | ICD-10-CM

## 2022-02-24 DIAGNOSIS — J069 Acute upper respiratory infection, unspecified: Secondary | ICD-10-CM

## 2022-02-24 DIAGNOSIS — R0981 Nasal congestion: Secondary | ICD-10-CM

## 2022-02-24 DIAGNOSIS — R0789 Other chest pain: Secondary | ICD-10-CM | POA: Diagnosis not present

## 2022-02-24 DIAGNOSIS — R062 Wheezing: Secondary | ICD-10-CM | POA: Diagnosis not present

## 2022-02-24 DIAGNOSIS — R059 Cough, unspecified: Secondary | ICD-10-CM | POA: Diagnosis not present

## 2022-02-24 MED ORDER — PREDNISONE 20 MG PO TABS: ORAL_TABLET | ORAL | 0 refills | Status: DC

## 2022-02-24 MED ORDER — PROMETHAZINE-DM 6.25-15 MG/5ML PO SYRP: 5.0000 mL | ORAL_SOLUTION | Freq: Every evening | ORAL | 0 refills | Status: DC | PRN

## 2022-02-24 MED ORDER — BENZONATATE 100 MG PO CAPS: 100.0000 mg | ORAL_CAPSULE | Freq: Three times a day (TID) | ORAL | 0 refills | Status: AC | PRN

## 2022-02-24 MED ORDER — LEVOCETIRIZINE DIHYDROCHLORIDE 5 MG PO TABS
5.0000 mg | ORAL_TABLET | Freq: Every evening | ORAL | 0 refills | Status: AC
Start: 2022-02-24 — End: ?

## 2022-02-24 NOTE — ED Provider Notes (Signed)
?[facility] ? ? ?MRN: [id number] DOB: 1981/01/22 ? ?Subjective:  ? ?Sheila Cline is a 41 y.o. female presenting for 4-day history of acute onset persistent sinus congestion, bilateral ear fullness and pain, cough, chest soreness, wheezing, difficulty with her breathing.  Her cough has been productive and has had some hemoptysis with it too.  No history of asthma.  Patient is not a smoker.  She did have 1 sick contact with her son who tested negative for COVID, flu, strep. ? ?No current facility-administered medications for this encounter. ? ?Current Outpatient Medications:  ?  enoxaparin (LOVENOX) 150 MG/ML injection, Inject 1 mL (150 mg total) into the skin daily., Disp: 30 Syringe, Rfl: 2 ?  iron polysaccharides (NIFEREX) 150 MG capsule, Take 1 capsule (150 mg total) by mouth daily., Disp: 30 capsule, Rfl: 3 ?  Prenatal MV-Min-FA-Omega-3 (PRENATAL GUMMIES/DHA & FA) 0.4-32.5 MG CHEW, Chew 2 each by mouth daily., Disp: , Rfl:   ? ?No Known Allergies ? ?Past Medical History:  ?Diagnosis Date  ? Chlamydia   ? Chronic interstitial cystitis 02/25/2013  ? Deep vein thrombosis (DVT) during pregnancy at 31 weeks (HCC) 04/12/2018  ? Deep venous embolism and thrombosis of left lower extremity (HCC) 04/12/2018  ? Dysplasia of cervix, low grade (CIN 1)   ? History of placenta abruption leading to loss at 26 weeks 09/25/2017  ? LAC, anticardiolipin neg; beta 2 glycoprotein neg  ? HPV (human papilloma virus) infection   ? HSV-2 (herpes simplex virus 2) infection   ? Interstitial cystitis   ? OAB (overactive bladder) 09/24/2013  ? Stress at home 09/24/2013  ? Superficial thrombosis of left lower extremity 09/2017  ?  ? ?Past Surgical History:  ?Procedure Laterality Date  ? CESAREAN SECTION    ? CESAREAN SECTION N/A 03/02/2018  ? Procedure: CESAREAN SECTION;  Surgeon: Catalina Antigua, MD;  [facility];  Service: Obstetrics;  Laterality: N/A;  ? tubes in ears    ? ? ?Family History  ?Problem Relation  Age of Onset  ? Interstitial cystitis Maternal Grandmother   ? Stroke Maternal Grandmother   ? Heart disease Maternal Grandmother   ? Cancer Maternal Aunt   ?     breast  ? Hypertension Mother   ? Asthma Mother   ? Asthma Brother   ? Asthma Daughter   ? Hypertension Maternal Grandfather   ? ? ?Social History  ? ?Tobacco Use  ? Smoking status: Never  ? Smokeless tobacco: Never  ?Vaping Use  ? Vaping Use: Never used  ?Substance Use Topics  ? Alcohol use: No  ?  Comment: occ wine; not now  ? Drug use: No  ? ? ?ROS ? ? ?Objective:  ? ?Vitals: ?BP (!) 144/96 (BP Location: Right Arm)   Pulse 86   Temp 98.7 ?F (37.1 ?C) (Oral)   Resp 18   Ht 4\' 11"  (1.499 m)   Wt 220 lb (99.8 kg)   LMP 02/19/2022   SpO2 (!) 85%   Breastfeeding No   BMI 44.43 kg/m?  ? ?Pulse oximetry was 95%.  ? ?Physical Exam ?Constitutional:   ?   General: She is not in acute distress. ?   Appearance: Normal appearance. She is well-developed and normal weight. She is not ill-appearing, toxic-appearing or diaphoretic.  ?HENT:  ?   Head: Normocephalic and atraumatic.  ?   Right Ear: Tympanic membrane, ear canal and external ear normal. No drainage or tenderness. No middle ear effusion. There is  no impacted cerumen. Tympanic membrane is not erythematous.  ?   Left Ear: Tympanic membrane, ear canal and external ear normal. No drainage or tenderness.  No middle ear effusion. There is no impacted cerumen. Tympanic membrane is not erythematous.  ?   Nose: Congestion present. No rhinorrhea.  ?   Mouth/Throat:  ?   Mouth: Mucous membranes are moist. No oral lesions.  ?   Pharynx: No pharyngeal swelling, oropharyngeal exudate, posterior oropharyngeal erythema or uvula swelling.  ?   Tonsils: No tonsillar exudate or tonsillar abscesses.  ?Eyes:  ?   General: No scleral icterus.    ?   Right eye: No discharge.     ?   Left eye: No discharge.  ?   Extraocular Movements: Extraocular movements intact.  ?   Right eye: Normal extraocular motion.  ?   Left eye:  Normal extraocular motion.  ?   Conjunctiva/sclera: Conjunctivae normal.  ?Cardiovascular:  ?   Rate and Rhythm: Normal rate.  ?   Heart sounds: No murmur heard. ?  No friction rub. No gallop.  ?Pulmonary:  ?   Effort: Pulmonary effort is normal. No respiratory distress.  ?   Breath sounds: No stridor. No wheezing, rhonchi or rales.  ?Chest:  ?   Chest wall: No tenderness.  ?Musculoskeletal:  ?   Cervical back: Normal range of motion and neck supple.  ?Lymphadenopathy:  ?   Cervical: No cervical adenopathy.  ?Skin: ?   General: Skin is warm and dry.  ?Neurological:  ?   General: No focal deficit present.  ?   Mental Status: She is alert and oriented to person, place, and time.  ?Psychiatric:     ?   Mood and Affect: Mood normal.     ?   Behavior: Behavior normal.  ? ?DG Chest 2 View ? ?Result Date: 02/24/2022 ?CLINICAL DATA:  Cough, wheeze, chest soreness EXAM: CHEST - 2 VIEW COMPARISON:  None. FINDINGS: The heart size and mediastinal contours are within normal limits. Both lungs are clear. The visualized skeletal structures are unremarkable. IMPRESSION: No active cardiopulmonary disease. Electronically Signed   By: Helyn Numbers M.D.   On: 02/24/2022 19:34   ? ?Assessment and Plan :  ? ?PDMP not reviewed this encounter. ? ?1. Viral upper respiratory illness   ?2. Subacute cough   ?3. Sinus congestion   ?4. Chest discomfort   ?5. Wheezing   ? ?Patient requested aggressive management especially for lower respiratory symptoms of wheezing, coughing and chest discomfort.  Offered her an oral prednisone course.  Will defer antibiotic use given negative chest x-ray and timeline of her illness.  Use supportive care otherwise. Counseled patient on potential for adverse effects with medications prescribed/recommended today, ER and return-to-clinic precautions discussed, patient verbalized understanding. ? ?  ?Wallis Bamberg, PA-C ?02/24/22 1946 ? ?

## 2022-02-24 NOTE — ED Triage Notes (Signed)
Pt reports cough, congestion for last several days.  ? ?Pt reports son has something similar and tested negative for covid, flu, strep earlier this week.  ?

## 2022-03-22 DIAGNOSIS — J309 Allergic rhinitis, unspecified: Secondary | ICD-10-CM | POA: Diagnosis not present

## 2022-03-22 DIAGNOSIS — R970 Elevated carcinoembryonic antigen [CEA]: Secondary | ICD-10-CM | POA: Diagnosis not present

## 2022-03-22 DIAGNOSIS — R0602 Shortness of breath: Secondary | ICD-10-CM | POA: Diagnosis not present

## 2022-03-22 DIAGNOSIS — Z6841 Body Mass Index (BMI) 40.0 and over, adult: Secondary | ICD-10-CM | POA: Diagnosis not present

## 2022-03-22 DIAGNOSIS — D682 Hereditary deficiency of other clotting factors: Secondary | ICD-10-CM | POA: Diagnosis not present

## 2022-03-22 DIAGNOSIS — I10 Essential (primary) hypertension: Secondary | ICD-10-CM | POA: Diagnosis not present

## 2022-06-03 DIAGNOSIS — I10 Essential (primary) hypertension: Secondary | ICD-10-CM | POA: Diagnosis not present

## 2022-06-03 DIAGNOSIS — D682 Hereditary deficiency of other clotting factors: Secondary | ICD-10-CM | POA: Diagnosis not present

## 2022-06-03 DIAGNOSIS — D649 Anemia, unspecified: Secondary | ICD-10-CM | POA: Diagnosis not present

## 2022-07-25 DIAGNOSIS — Z6841 Body Mass Index (BMI) 40.0 and over, adult: Secondary | ICD-10-CM | POA: Diagnosis not present

## 2022-07-25 DIAGNOSIS — Z0000 Encounter for general adult medical examination without abnormal findings: Secondary | ICD-10-CM | POA: Diagnosis not present

## 2022-11-21 DIAGNOSIS — I10 Essential (primary) hypertension: Secondary | ICD-10-CM | POA: Diagnosis not present

## 2022-11-21 DIAGNOSIS — J309 Allergic rhinitis, unspecified: Secondary | ICD-10-CM | POA: Diagnosis not present

## 2022-11-21 DIAGNOSIS — Z6841 Body Mass Index (BMI) 40.0 and over, adult: Secondary | ICD-10-CM | POA: Diagnosis not present

## 2022-11-21 DIAGNOSIS — D682 Hereditary deficiency of other clotting factors: Secondary | ICD-10-CM | POA: Diagnosis not present

## 2023-02-14 DIAGNOSIS — I10 Essential (primary) hypertension: Secondary | ICD-10-CM | POA: Diagnosis not present

## 2023-02-14 DIAGNOSIS — Z6841 Body Mass Index (BMI) 40.0 and over, adult: Secondary | ICD-10-CM | POA: Diagnosis not present

## 2023-02-14 DIAGNOSIS — D682 Hereditary deficiency of other clotting factors: Secondary | ICD-10-CM | POA: Diagnosis not present

## 2023-02-14 DIAGNOSIS — J309 Allergic rhinitis, unspecified: Secondary | ICD-10-CM | POA: Diagnosis not present

## 2023-05-22 DIAGNOSIS — J309 Allergic rhinitis, unspecified: Secondary | ICD-10-CM | POA: Diagnosis not present

## 2023-05-22 DIAGNOSIS — I10 Essential (primary) hypertension: Secondary | ICD-10-CM | POA: Diagnosis not present

## 2023-05-22 DIAGNOSIS — D682 Hereditary deficiency of other clotting factors: Secondary | ICD-10-CM | POA: Diagnosis not present

## 2023-05-22 DIAGNOSIS — Z6841 Body Mass Index (BMI) 40.0 and over, adult: Secondary | ICD-10-CM | POA: Diagnosis not present

## 2023-08-21 DIAGNOSIS — I10 Essential (primary) hypertension: Secondary | ICD-10-CM | POA: Diagnosis not present

## 2023-08-21 DIAGNOSIS — J309 Allergic rhinitis, unspecified: Secondary | ICD-10-CM | POA: Diagnosis not present

## 2023-08-21 DIAGNOSIS — Z6841 Body Mass Index (BMI) 40.0 and over, adult: Secondary | ICD-10-CM | POA: Diagnosis not present

## 2023-08-21 DIAGNOSIS — D682 Hereditary deficiency of other clotting factors: Secondary | ICD-10-CM | POA: Diagnosis not present

## 2023-08-21 DIAGNOSIS — Z0000 Encounter for general adult medical examination without abnormal findings: Secondary | ICD-10-CM | POA: Diagnosis not present

## 2024-08-19 ENCOUNTER — Other Ambulatory Visit (HOSPITAL_COMMUNITY)
Admission: RE | Admit: 2024-08-19 | Discharge: 2024-08-19 | Disposition: A | Discharge status: Home / Self Care | Source: Ambulatory Visit | Attending: Obstetrics & Gynecology | Admitting: Obstetrics & Gynecology

## 2024-08-19 ENCOUNTER — Ambulatory Visit: Admitting: Obstetrics & Gynecology

## 2024-08-19 ENCOUNTER — Encounter: Payer: Self-pay | Admitting: Obstetrics & Gynecology

## 2024-08-19 VITALS — BP 141/87 | HR 86 | Ht 59.0 in | Wt 224.0 lb

## 2024-08-19 DIAGNOSIS — Z01419 Encounter for gynecological examination (general) (routine) without abnormal findings: Secondary | ICD-10-CM

## 2024-08-19 DIAGNOSIS — Z1231 Encounter for screening mammogram for malignant neoplasm of breast: Secondary | ICD-10-CM | POA: Diagnosis not present

## 2024-08-19 DIAGNOSIS — Z1331 Encounter for screening for depression: Secondary | ICD-10-CM | POA: Diagnosis not present

## 2024-08-19 DIAGNOSIS — Z113 Encounter for screening for infections with a predominantly sexual mode of transmission: Secondary | ICD-10-CM

## 2024-08-19 NOTE — Addendum Note (Signed)
 Addended by: ILEAN RUTHERFORD HERO on: 08/19/2024 09:52 AM   Modules accepted: Orders

## 2024-08-19 NOTE — Progress Notes (Signed)
 WELL-WOMAN EXAMINATION Patient name: Sheila Cline MRN [id number]  Date of birth: Jan 29, 1981 Chief Complaint:   Annual Exam  History of Present Illness:   Sheila Cline is a 43 y.o. [phone] female being seen today for a routine well-woman exam.   H/o abnormal pap- LSIL  Menses are regular- typically about 5-6 days.  Day 2 moderate to heavy using Diva cup- change 2-3x day.  Some dysmenorrhea- using holistic cream.  Notes h/o interstitial cystitis controlled with diet   Patient's last menstrual period was 08/19/2024.  The current method of family planning is vasectomy.    Last pap 2018- LSIL, HPV, []  collected today Last mammogram: ordered today . Last colonoscopy: NA     08/19/2024    9:23 AM 08/01/2018    2:53 PM 11/29/2017    9:48 AM 10/23/2017    9:03 AM 10/09/2017    8:01 AM  Depression screen PHQ 2/9  Decreased Interest 0 0 0 0 0  Down, Depressed, Hopeless 0 0 0 0 0  PHQ - 2 Score 0 0 0 0 0  Altered sleeping 2 0 0 0 0  Tired, decreased energy 3 0 1 1 1   Change in appetite 2 0 0 0 1  Feeling bad or failure about yourself  0 0 0 0 0  Trouble concentrating 0 0 0 0 0  Moving slowly or fidgety/restless 0 0 0 0 0  Suicidal thoughts 0 0 0 0 0  PHQ-9 Score 7 0 1 1 2       Review of Systems:   Pertinent items are noted in HPI Denies any headaches, blurred vision, fatigue, shortness of breath, chest pain, abdominal pain, bowel movements, urination, or intercourse unless otherwise stated above.  Pertinent History Reviewed:  Reviewed past medical,surgical, social and family history.  Reviewed problem list, medications and allergies. Physical Assessment:   Vitals:   08/19/24 0905 08/19/24 0924  BP: (!) 152/95 (!) 141/87  Pulse: 85 86  Weight: 224 lb (101.6 kg)   Height: 4' 11 (1.499 m)   Body mass index is 45.24 kg/m.        Physical Examination:   General appearance - well appearing, and in no distress  Mental status - alert, oriented to person, place, and  time  Psych:  She has a normal mood and affect  Skin - warm and dry, normal color, no suspicious lesions noted  Chest - effort normal, all lung fields clear to auscultation bilaterally  Heart - normal rate and regular rhythm  Neck:  midline trachea, no thyromegaly or nodules  Breasts - breasts appear normal, no suspicious masses, no skin or nipple changes or  axillary nodes  Abdomen - obese, soft, nontender, nondistended, no masses or organomegaly  Pelvic - VULVA: normal appearing vulva with no masses, tenderness or lesions  VAGINA: normal appearing vagina with normal color and discharge, no lesions currently on menses- moderate flow noted.  CERVIX: normal appearing cervix without discharge or lesions, no CMT  Thin prep pap is done with HR HPV cotesting  UTERUS: uterus is felt to be normal size, shape, consistency and nontender   ADNEXA: No adnexal masses or tenderness noted.  Extremities:  No swelling or varicosities noted  Chaperone: Engineer, civil (consulting) & Plan:  1) Well-Woman Exam -pap collected, reviewed ASCCP guidelines - Next step pending results of today's Pap - Mammogram ordered, reviewed screening guidelines   Orders Placed This Encounter  Procedures   MM 3D SCREENING  MAMMOGRAM BILATERAL BREAST    Meds: No orders of the defined types were placed in this encounter.   Follow-up: Return in about 1 year (around 08/19/2025) for Annual.   Porfirio Bollier, DO Attending Obstetrician & Gynecologist, [facility] for [facility], [facility]

## 2024-08-20 ENCOUNTER — Ambulatory Visit: Payer: Self-pay | Admitting: Obstetrics & Gynecology

## 2024-08-20 LAB — CYTOLOGY - PAP
Adequacy: ABSENT
Chlamydia: NEGATIVE
Comment: NEGATIVE
Comment: NEGATIVE
Comment: NORMAL
Diagnosis: NEGATIVE
High risk HPV: NEGATIVE
Neisseria Gonorrhea: NEGATIVE

## 2024-08-30 ENCOUNTER — Ambulatory Visit (HOSPITAL_COMMUNITY)
Admission: RE | Admit: 2024-08-30 | Discharge: 2024-08-30 | Disposition: A | Discharge status: Home / Self Care | Source: Ambulatory Visit | Attending: Obstetrics & Gynecology | Admitting: Obstetrics & Gynecology

## 2024-08-30 DIAGNOSIS — Z1231 Encounter for screening mammogram for malignant neoplasm of breast: Secondary | ICD-10-CM | POA: Diagnosis present

## 2024-09-26 ENCOUNTER — Telehealth: Admitting: Obstetrics & Gynecology

## 2024-09-26 ENCOUNTER — Encounter: Payer: Self-pay | Admitting: Obstetrics & Gynecology

## 2024-09-26 ENCOUNTER — Telehealth: Admitting: Physician Assistant

## 2024-09-26 DIAGNOSIS — L708 Other acne: Secondary | ICD-10-CM

## 2024-09-26 DIAGNOSIS — Z01419 Encounter for gynecological examination (general) (routine) without abnormal findings: Secondary | ICD-10-CM

## 2024-09-26 DIAGNOSIS — Z7189 Other specified counseling: Secondary | ICD-10-CM

## 2024-09-26 NOTE — Progress Notes (Signed)
 Because of wanting to discuss PCOS,  I feel your condition warrants further evaluation and I recommend that you be seen in a face to face visit with your gynecologist or at one of our [facility] clinics.   NOTE: There will be NO CHARGE for this eVisit   If you are having a true medical emergency please call 911.    *Center for [facility] at [facility]             [address], [place], [place] [address] [phone] (*Take patients with no insurance)  *Center for [employer] at [facility] [address] [place], [place],  [place]  [address] [phone] (*Take patients with no insurance)  Center for [employer] at [facility]                                                             [address], [place], [place], [address] [phone]  Center for [facility] at [facility] [address], [address], [place], [place], [address] [phone]  Center for [facility] at [facility] [address], [place], [place], [address] [phone]  Center for [facility] at [facility]                                 [address] [place], [place], [place], [address] [phone]  Center for [facility] at [facility]                                    [address] [place], [place], [address] [phone]  Center for [facility] at [facility] [address], [place], [place], [address]                              [facility] [address], [place], [place], [address] [phone]  Your MyChart E-visit questionnaire answers were reviewed by a board certified advanced clinical practitioner to complete your personal care plan based on your specific symptoms.  Thank you for using e-Visits.

## 2024-09-26 NOTE — Progress Notes (Signed)
 TELEHEALTH GYNECOLOGY VISIT ENCOUNTER NOTE  Provider location: [facility]   Patient location: Home  I connected with Sheila Cline on 09/26/24 at  9:50 AM EDT by telephone and verified that I am speaking with the correct person using two identifiers. Patient was unable to do MyChart audiovisual encounter due to technical difficulties, she tried several times.    I discussed the limitations, risks, security and privacy concerns of performing an evaluation and management service by telephone and the availability of in person appointments. I also discussed with the patient that there may be a patient responsible charge related to this service. The patient expressed understanding and agreed to proceed.   History:  Sheila Cline is a 43 y.o. [phone] female being evaluated today for discussion regarding PCOS.   She reports difficulty with weight management, acne as well as hirsutism.  She was concerned about potential PCOS and management.  Patient notes no prior difficulty with pregnancies.  Currently menses are regular each month.  Denies skipping menses.  She denies any abnormal vaginal discharge, bleeding, pelvic pain or other concerns.    No other acute complaints     Past Medical History:  Diagnosis Date   Chlamydia    Chronic interstitial cystitis 02/25/2013   Deep vein thrombosis (DVT) during pregnancy at 31 weeks (HCC) 04/12/2018   Deep venous embolism and thrombosis of left lower extremity (HCC) 04/12/2018   Dysplasia of cervix, low grade (CIN 1)    Factor 5 Leiden mutation, heterozygous    History of placenta abruption leading to loss at 26 weeks 09/25/2017   LAC, anticardiolipin neg; beta 2 glycoprotein neg   HPV (human papilloma virus) infection    HSV-2 (herpes simplex virus 2) infection    Hypertension    Interstitial cystitis    OAB (overactive bladder) 09/24/2013   Stress at home 09/24/2013   Superficial thrombosis of left lower  extremity 09/2017   Vaginal Pap smear, abnormal    Past Surgical History:  Procedure Laterality Date   CESAREAN SECTION     CESAREAN SECTION N/A 03/02/2018   Procedure: CESAREAN SECTION;  Surgeon: Alger Gong, MD;  Location: [facility];  Service: Obstetrics;  Laterality: N/A;   COLPOSCOPY     tubes in ears     The following portions of the patient's history were reviewed and updated as appropriate: allergies, current medications, past family history, past medical history, past social history, past surgical history and problem list.   Health Maintenance:  Normal pap and negative HRHPV on 08/2024.  Normal mammogram on 08/2024.   Review of Systems:  Pertinent items noted in HPI and remainder of comprehensive ROS otherwise negative.  Physical Exam:   General:  Alert, oriented and cooperative.   Mental Status: Normal mood and affect perceived. Normal judgment and thought content.  Physical exam deferred due to nature of the encounter  On camera- no obvious evidence of hirsuitism or acne  Labs and Imaging No results found for this or any previous visit (from the past 2 weeks). MM 3D SCREENING MAMMOGRAM BILATERAL BREAST Result Date: 09/03/2024 CLINICAL DATA:  Screening. EXAM: DIGITAL SCREENING BILATERAL MAMMOGRAM WITH TOMOSYNTHESIS AND CAD TECHNIQUE: Bilateral screening digital craniocaudal and mediolateral oblique mammograms were obtained. Bilateral screening digital breast tomosynthesis was performed. The images were evaluated with computer-aided detection. COMPARISON:  None available. ACR Breast Density Category b: There are scattered areas of fibroglandular density. FINDINGS: There are no findings suspicious for malignancy. IMPRESSION: No  mammographic evidence of malignancy. A result letter of this screening mammogram will be mailed directly to the patient. RECOMMENDATION: Screening mammogram in one year. (Code:SM-B-01Y) BI-RADS CATEGORY  1: Negative. Electronically Signed   By:  Corean Salter M.D.   On: 09/03/2024 11:12      Assessment and Plan:     -Counseling about health -Weight management  -Reviewed diagnosis and definition of PCOS.  Based on the fact that she has normal menses low suspicion for PCOS.  Discussed typical findings and reviewed management and when and why it is indicated. 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   Lawerance Matsuo M Margaret Cockerill, DO Center for [employer], [facility]
